# Patient Record
Sex: Male | Born: 1970 | Race: Black or African American | Hispanic: No | Marital: Single | Smoking: Former smoker
Health system: Southern US, Community
[De-identification: ages and names within clinical notes are randomized; demographics above are authoritative.]

## PROBLEM LIST (undated history)

## (undated) DIAGNOSIS — I2699 Other pulmonary embolism without acute cor pulmonale: Secondary | ICD-10-CM

## (undated) DIAGNOSIS — K219 Gastro-esophageal reflux disease without esophagitis: Secondary | ICD-10-CM

## (undated) DIAGNOSIS — C801 Malignant (primary) neoplasm, unspecified: Secondary | ICD-10-CM

## (undated) DIAGNOSIS — D573 Sickle-cell trait: Secondary | ICD-10-CM

## (undated) DIAGNOSIS — D689 Coagulation defect, unspecified: Secondary | ICD-10-CM

## (undated) DIAGNOSIS — R221 Localized swelling, mass and lump, neck: Secondary | ICD-10-CM

## (undated) HISTORY — DX: Coagulation defect, unspecified: D68.9

## (undated) HISTORY — DX: Localized swelling, mass and lump, neck: R22.1

## (undated) HISTORY — PX: SURGERY SCROTAL / TESTICULAR: SUR1316

---

## 2007-06-12 ENCOUNTER — Emergency Department (HOSPITAL_COMMUNITY): Admission: EM | Admit: 2007-06-12 | Discharge: 2007-06-13 | Payer: Self-pay | Admitting: Emergency Medicine

## 2008-06-10 ENCOUNTER — Ambulatory Visit: Payer: Self-pay | Admitting: Gastroenterology

## 2010-10-19 LAB — DIFFERENTIAL
Lymphocytes Relative: 25
Monocytes Absolute: 0.4
Monocytes Relative: 5
Neutro Abs: 4.9

## 2010-10-19 LAB — COMPREHENSIVE METABOLIC PANEL
Albumin: 4.1
BUN: 9
Calcium: 9.2
Creatinine, Ser: 0.78
Total Protein: 7

## 2010-10-19 LAB — URINALYSIS, ROUTINE W REFLEX MICROSCOPIC
Glucose, UA: NEGATIVE
Hgb urine dipstick: NEGATIVE
Ketones, ur: NEGATIVE
Protein, ur: NEGATIVE

## 2010-10-19 LAB — CBC
HCT: 45.3
MCHC: 33.8
MCV: 81.9
Platelets: 176
RDW: 13.8

## 2011-08-24 ENCOUNTER — Other Ambulatory Visit: Payer: Self-pay | Admitting: Family Medicine

## 2011-08-24 DIAGNOSIS — R220 Localized swelling, mass and lump, head: Secondary | ICD-10-CM

## 2011-08-29 ENCOUNTER — Ambulatory Visit
Admission: RE | Admit: 2011-08-29 | Discharge: 2011-08-29 | Disposition: A | Discharge status: Home / Self Care | Payer: 59 | Source: Ambulatory Visit | Attending: Family Medicine | Admitting: Family Medicine

## 2011-08-29 DIAGNOSIS — R221 Localized swelling, mass and lump, neck: Secondary | ICD-10-CM

## 2011-08-29 MED ORDER — IOHEXOL 300 MG/ML  SOLN
75.0000 mL | Freq: Once | INTRAMUSCULAR | Status: AC | PRN
  Administered 2011-08-29: 75 mL via INTRAVENOUS

## 2011-09-14 ENCOUNTER — Encounter (INDEPENDENT_AMBULATORY_CARE_PROVIDER_SITE_OTHER): Payer: Self-pay

## 2011-09-14 ENCOUNTER — Other Ambulatory Visit (INDEPENDENT_AMBULATORY_CARE_PROVIDER_SITE_OTHER): Payer: Self-pay | Admitting: Surgery

## 2011-09-14 ENCOUNTER — Encounter (INDEPENDENT_AMBULATORY_CARE_PROVIDER_SITE_OTHER): Payer: Self-pay | Admitting: Surgery

## 2011-09-14 ENCOUNTER — Ambulatory Visit (INDEPENDENT_AMBULATORY_CARE_PROVIDER_SITE_OTHER): Payer: 59 | Admitting: Surgery

## 2011-09-14 VITALS — BP 138/100 | HR 78 | Temp 98.3°F | Resp 18 | Ht 67.5 in | Wt 251.0 lb

## 2011-09-14 DIAGNOSIS — D1739 Benign lipomatous neoplasm of skin and subcutaneous tissue of other sites: Secondary | ICD-10-CM

## 2011-09-14 DIAGNOSIS — D170 Benign lipomatous neoplasm of skin and subcutaneous tissue of head, face and neck: Secondary | ICD-10-CM | POA: Insufficient documentation

## 2011-09-14 NOTE — Progress Notes (Signed)
Chief Complaint:  Mass of the left neck    History of Present Illness:  Mike Webb is an 41 y.o. male referred by Dr. Lesly Rubenstein with a mass in the left neck and shoulder that appears to be increasing in size.  I discussed removal and the potential for recurrence, nerve pain, weakness or numbness that could occur as a result of removing it.  He wants to have it removed.   Past Medical History  Diagnosis Date  . Neck mass   . Clotting disorder     No past surgical history on file.  Current Outpatient Prescriptions  Medication Sig Dispense Refill  . CLONAZEPAM PO Take by mouth.      . cyclobenzaprine (FLEXERIL) 10 MG tablet Take 10 mg by mouth 3 (three) times daily as needed.      Marland Kitchen HYDROcodone-acetaminophen (NORCO/VICODIN) 5-325 MG per tablet Take 1 tablet by mouth every 6 (six) hours as needed.      . Testosterone (ANDROGEL TD) Place onto the skin.      . Varenicline Tartrate (CHANTIX PO) Take by mouth.      . zolpidem (AMBIEN) 10 MG tablet Take 10 mg by mouth at bedtime as needed.       Review of patient's allergies indicates no known allergies. No family history on file. Social History:   reports that he has quit smoking. He does not have any smokeless tobacco history on file. He reports that he drinks about 6 ounces of alcohol per week. He reports that he does not use illicit drugs.   REVIEW OF SYSTEMS - PERTINENT POSITIVES ONLY: PE after flying on military transport  Physical Exam:   Blood pressure 138/100, pulse 78, temperature 98.3 F (36.8 C), temperature source Oral, resp. rate 18, height 5' 7.5" (1.715 m), weight 251 lb (113.853 kg). Body mass index is 38.73 kg/(m^2).  Gen:  WDWN AAM NAD  Neurological: Alert and oriented to person, place, and time. Motor and sensory function is grossly intact  Head: Normocephalic and atraumatic.  Eyes: Conjunctivae are normal. Pupils are equal, round, and reactive to light. No scleral icterus. There is a large soft mass in the left  supraclavicular region posteriorally.  It is nontender.  Neck: Normal range of motion. Neck supple. No tracheal deviation or thyromegaly present.  Cardiovascular:  SR without murmurs or gallops.  No carotid bruits Respiratory: Effort normal.  No respiratory distress. No chest wall tenderness. Breath sounds normal.  No wheezes, rales or rhonchi.  Abdomen:  nontender GU: Musculoskeletal: Normal range of motion. Extremities are nontender. No cyanosis, edema or clubbing noted Lymphadenopathy: No cervical, preauricular, postauricular or axillary adenopathy is present Skin: Skin is warm and dry. No rash noted. No diaphoresis. No erythema. No pallor. Pscyh: Normal mood and affect. Behavior is normal. Judgment and thought content normal.   LABORATORY RESULTS: No results found for this or any previous visit (from the past 48 hour(s)).  RADIOLOGY RESULTS: No results found.  Problem List: There is no problem list on file for this patient.   Assessment & Plan: Left shoulder/neck mass.  Plan excision under general at [facility]    Matt B. Daphine Deutscher, MD, [facility] [phone] beeper [phone]  09/14/2011 12:05 PM

## 2011-09-14 NOTE — Patient Instructions (Signed)
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 This mass could recur after removal

## 2011-10-27 ENCOUNTER — Telehealth (INDEPENDENT_AMBULATORY_CARE_PROVIDER_SITE_OTHER): Payer: Self-pay | Admitting: General Surgery

## 2011-10-27 ENCOUNTER — Encounter (INDEPENDENT_AMBULATORY_CARE_PROVIDER_SITE_OTHER): Payer: 59 | Admitting: Surgery

## 2011-10-27 NOTE — Telephone Encounter (Signed)
Spoke with pt and informed him that his NP Elizabeth Palau had informed me of his new condition and that after speaking with Dr. Daphine Deutscher that we are going to hold off on his lipoma excision for now.  I explained that after they finish running tests and find a treatment plan for him then we can always revisit this surgery later.  I informed him that he can call us anytime if he has any other problems and when he feels he is ready to remove the lipoma.

## 2011-11-03 ENCOUNTER — Ambulatory Visit (HOSPITAL_BASED_OUTPATIENT_CLINIC_OR_DEPARTMENT_OTHER): Admission: RE | Admit: 2011-11-03 | Payer: 59 | Source: Ambulatory Visit | Admitting: Surgery

## 2011-11-03 ENCOUNTER — Encounter (HOSPITAL_BASED_OUTPATIENT_CLINIC_OR_DEPARTMENT_OTHER): Admission: RE | Payer: Self-pay | Source: Ambulatory Visit

## 2011-11-03 SURGERY — EXCISION LIPOMA
Anesthesia: General

## 2012-02-09 ENCOUNTER — Emergency Department (HOSPITAL_COMMUNITY): Payer: BC Managed Care – PPO

## 2012-02-09 ENCOUNTER — Encounter (HOSPITAL_COMMUNITY): Payer: Self-pay

## 2012-02-09 ENCOUNTER — Inpatient Hospital Stay (HOSPITAL_COMMUNITY)
Admission: EM | Admit: 2012-02-09 | Discharge: 2012-02-12 | DRG: 208 | Disposition: A | Discharge status: Home / Self Care | Payer: BC Managed Care – PPO | Attending: Internal Medicine | Admitting: Internal Medicine

## 2012-02-09 ENCOUNTER — Inpatient Hospital Stay (HOSPITAL_COMMUNITY): Payer: BC Managed Care – PPO

## 2012-02-09 DIAGNOSIS — K8010 Calculus of gallbladder with chronic cholecystitis without obstruction: Principal | ICD-10-CM | POA: Diagnosis present

## 2012-02-09 DIAGNOSIS — K8020 Calculus of gallbladder without cholecystitis without obstruction: Secondary | ICD-10-CM

## 2012-02-09 DIAGNOSIS — F17200 Nicotine dependence, unspecified, uncomplicated: Secondary | ICD-10-CM | POA: Diagnosis present

## 2012-02-09 DIAGNOSIS — Z79899 Other long term (current) drug therapy: Secondary | ICD-10-CM

## 2012-02-09 DIAGNOSIS — I2699 Other pulmonary embolism without acute cor pulmonale: Secondary | ICD-10-CM | POA: Diagnosis present

## 2012-02-09 DIAGNOSIS — K219 Gastro-esophageal reflux disease without esophagitis: Secondary | ICD-10-CM | POA: Diagnosis present

## 2012-02-09 DIAGNOSIS — T391X1A Poisoning by 4-Aminophenol derivatives, accidental (unintentional), initial encounter: Secondary | ICD-10-CM

## 2012-02-09 DIAGNOSIS — K7689 Other specified diseases of liver: Secondary | ICD-10-CM | POA: Diagnosis present

## 2012-02-09 DIAGNOSIS — C801 Malignant (primary) neoplasm, unspecified: Secondary | ICD-10-CM | POA: Diagnosis present

## 2012-02-09 DIAGNOSIS — F329 Major depressive disorder, single episode, unspecified: Secondary | ICD-10-CM

## 2012-02-09 DIAGNOSIS — D573 Sickle-cell trait: Secondary | ICD-10-CM | POA: Diagnosis present

## 2012-02-09 DIAGNOSIS — I10 Essential (primary) hypertension: Secondary | ICD-10-CM

## 2012-02-09 DIAGNOSIS — D689 Coagulation defect, unspecified: Secondary | ICD-10-CM | POA: Diagnosis present

## 2012-02-09 DIAGNOSIS — Z8547 Personal history of malignant neoplasm of testis: Secondary | ICD-10-CM

## 2012-02-09 DIAGNOSIS — R209 Unspecified disturbances of skin sensation: Secondary | ICD-10-CM | POA: Diagnosis present

## 2012-02-09 DIAGNOSIS — R748 Abnormal levels of other serum enzymes: Secondary | ICD-10-CM

## 2012-02-09 DIAGNOSIS — K819 Cholecystitis, unspecified: Secondary | ICD-10-CM | POA: Diagnosis present

## 2012-02-09 DIAGNOSIS — Z86711 Personal history of pulmonary embolism: Secondary | ICD-10-CM

## 2012-02-09 DIAGNOSIS — F3289 Other specified depressive episodes: Secondary | ICD-10-CM | POA: Diagnosis present

## 2012-02-09 DIAGNOSIS — R7401 Elevation of levels of liver transaminase levels: Secondary | ICD-10-CM | POA: Diagnosis present

## 2012-02-09 DIAGNOSIS — R531 Weakness: Secondary | ICD-10-CM | POA: Diagnosis present

## 2012-02-09 DIAGNOSIS — R29898 Other symptoms and signs involving the musculoskeletal system: Secondary | ICD-10-CM | POA: Diagnosis present

## 2012-02-09 DIAGNOSIS — R1011 Right upper quadrant pain: Secondary | ICD-10-CM

## 2012-02-09 DIAGNOSIS — R7402 Elevation of levels of lactic acid dehydrogenase (LDH): Secondary | ICD-10-CM | POA: Diagnosis present

## 2012-02-09 DIAGNOSIS — M6281 Muscle weakness (generalized): Secondary | ICD-10-CM

## 2012-02-09 HISTORY — DX: Other pulmonary embolism without acute cor pulmonale: I26.99

## 2012-02-09 HISTORY — DX: Sickle-cell trait: D57.3

## 2012-02-09 HISTORY — DX: Malignant (primary) neoplasm, unspecified: C80.1

## 2012-02-09 LAB — TROPONIN I: Troponin I: <0.3 ng/mL (ref <0.30)

## 2012-02-09 LAB — RAPID URINE DRUG SCREEN, HOSP PERFORMED
Barbiturates: NOT DETECTED
Benzodiazepines: NOT DETECTED
Cocaine: NOT DETECTED
Opiates: NOT DETECTED
Tetrahydrocannabinol: POSITIVE — AB

## 2012-02-09 LAB — POCT I-STAT, CHEM 8
BUN: <3 mg/dL — ABNORMAL LOW (ref 6–23)
Calcium, Ion: 1.17 mmol/L (ref 1.12–1.23)
Chloride: 106 mEq/L (ref 96–112)
Glucose, Bld: 128 mg/dL — ABNORMAL HIGH (ref 70–99)
TCO2: 25 mmol/L (ref 0–100)

## 2012-02-09 LAB — URINALYSIS, ROUTINE W REFLEX MICROSCOPIC
Glucose, UA: NEGATIVE mg/dL
Ketones, ur: 40 mg/dL — AB
Specific Gravity, Urine: 1.012 (ref 1.005–1.030)
pH: 6 (ref 5.0–8.0)

## 2012-02-09 LAB — PROTIME-INR: Prothrombin Time: 12.4 seconds (ref 11.6–15.2)

## 2012-02-09 LAB — COMPREHENSIVE METABOLIC PANEL
ALT: 907 U/L — ABNORMAL HIGH (ref 0–53)
Alkaline Phosphatase: 127 U/L — ABNORMAL HIGH (ref 39–117)
BUN: 5 mg/dL — ABNORMAL LOW (ref 6–23)
CO2: 24 mEq/L (ref 19–32)
GFR calc Af Amer: >90 mL/min (ref >90)
GFR calc non Af Amer: >90 mL/min (ref >90)
Glucose, Bld: 81 mg/dL (ref 70–99)
Potassium: 3.5 mEq/L (ref 3.5–5.1)
Total Bilirubin: 5.4 mg/dL — ABNORMAL HIGH (ref 0.3–1.2)
Total Protein: 8 g/dL (ref 6.0–8.3)

## 2012-02-09 LAB — CBC WITH DIFFERENTIAL/PLATELET
Eosinophils Absolute: 0.2 10*3/uL (ref 0.0–0.7)
Eosinophils Relative: 3 % (ref 0–5)
Hemoglobin: 15.3 g/dL (ref 13.0–17.0)
Lymphocytes Relative: 22 % (ref 12–46)
Lymphs Abs: 1.1 10*3/uL (ref 0.7–4.0)
MCH: 26.7 pg (ref 26.0–34.0)
MCV: 78.2 fL (ref 78.0–100.0)
Monocytes Relative: 8 % (ref 3–12)
Neutrophils Relative %: 66 % (ref 43–77)
RBC: 5.74 MIL/uL (ref 4.22–5.81)
WBC: 4.9 10*3/uL (ref 4.0–10.5)

## 2012-02-09 LAB — CBC
HCT: 41.4 % (ref 39.0–52.0)
WBC: 5.5 10*3/uL (ref 4.0–10.5)

## 2012-02-09 LAB — LIPASE, BLOOD: Lipase: 15 U/L (ref 11–59)

## 2012-02-09 LAB — APTT: aPTT: 34 seconds (ref 24–37)

## 2012-02-09 MED ORDER — ACETYLCYSTEINE LOAD VIA INFUSION
150.0000 mg/kg | Freq: Once | INTRAVENOUS | Status: AC
  Administered 2012-02-09: 16800 mg via INTRAVENOUS
  Filled 2012-02-09: qty 420

## 2012-02-09 MED ORDER — ONDANSETRON HCL 4 MG/2ML IJ SOLN: 4.0000 mg | Freq: Four times a day (QID) | INTRAMUSCULAR | Status: DC | PRN

## 2012-02-09 MED ORDER — SODIUM CHLORIDE 0.9 % IV SOLN
INTRAVENOUS | Status: AC
  Administered 2012-02-09 – 2012-02-10 (×2): via INTRAVENOUS

## 2012-02-09 MED ORDER — IOHEXOL 300 MG/ML  SOLN
100.0000 mL | Freq: Once | INTRAMUSCULAR | Status: AC | PRN
  Administered 2012-02-09: 100 mL via INTRAVENOUS

## 2012-02-09 MED ORDER — ONDANSETRON HCL 4 MG PO TABS: 4.0000 mg | ORAL_TABLET | Freq: Four times a day (QID) | ORAL | Status: DC | PRN

## 2012-02-09 MED ORDER — CLORAZEPATE DIPOTASSIUM 3.75 MG PO TABS
7.5000 mg | ORAL_TABLET | Freq: Two times a day (BID) | ORAL | Status: DC
  Administered 2012-02-09 – 2012-02-11 (×5): 7.5 mg via ORAL
  Filled 2012-02-09 (×3): qty 2
  Filled 2012-02-09: qty 1
  Filled 2012-02-09: qty 2
  Filled 2012-02-09: qty 1
  Filled 2012-02-09: qty 2

## 2012-02-09 MED ORDER — IBUPROFEN 600 MG PO TABS
600.0000 mg | ORAL_TABLET | Freq: Four times a day (QID) | ORAL | Status: DC | PRN
  Administered 2012-02-10 (×2): 600 mg via ORAL
  Filled 2012-02-09 (×2): qty 1

## 2012-02-09 MED ORDER — ESCITALOPRAM OXALATE 10 MG PO TABS
10.0000 mg | ORAL_TABLET | Freq: Every day | ORAL | Status: DC
  Administered 2012-02-09 – 2012-02-12 (×4): 10 mg via ORAL
  Filled 2012-02-09 (×4): qty 1

## 2012-02-09 MED ORDER — CYCLOBENZAPRINE HCL 10 MG PO TABS
10.0000 mg | ORAL_TABLET | Freq: Three times a day (TID) | ORAL | Status: DC | PRN
  Filled 2012-02-09: qty 1

## 2012-02-09 MED ORDER — MORPHINE SULFATE 2 MG/ML IJ SOLN
2.0000 mg | INTRAMUSCULAR | Status: DC | PRN
  Administered 2012-02-09 – 2012-02-11 (×3): 2 mg via INTRAVENOUS
  Filled 2012-02-09 (×3): qty 1

## 2012-02-09 MED ORDER — ALUM & MAG HYDROXIDE-SIMETH 200-200-20 MG/5ML PO SUSP
30.0000 mL | Freq: Four times a day (QID) | ORAL | Status: DC | PRN
  Administered 2012-02-12 (×2): 30 mL via ORAL
  Filled 2012-02-09 (×2): qty 30

## 2012-02-09 MED ORDER — SODIUM CHLORIDE 0.9 % IJ SOLN
3.0000 mL | Freq: Two times a day (BID) | INTRAMUSCULAR | Status: DC
  Administered 2012-02-09 – 2012-02-11 (×4): 3 mL via INTRAVENOUS

## 2012-02-09 MED ORDER — ALBUTEROL SULFATE (5 MG/ML) 0.5% IN NEBU: 2.5000 mg | INHALATION_SOLUTION | RESPIRATORY_TRACT | Status: DC | PRN

## 2012-02-09 MED ORDER — ASPIRIN 81 MG PO CHEW
81.0000 mg | CHEWABLE_TABLET | Freq: Every day | ORAL | Status: DC
  Administered 2012-02-09 – 2012-02-12 (×4): 81 mg via ORAL
  Filled 2012-02-09 (×4): qty 1

## 2012-02-09 MED ORDER — ACETYLCYSTEINE 200 MG/ML IV SOLN
15.0000 mg/kg/h | INTRAVENOUS | Status: DC
  Administered 2012-02-09 – 2012-02-10 (×3): 15 mg/kg/h via INTRAVENOUS
  Filled 2012-02-09 (×3): qty 200

## 2012-02-09 MED ORDER — HEPARIN SODIUM (PORCINE) 5000 UNIT/ML IJ SOLN
5000.0000 [IU] | Freq: Three times a day (TID) | INTRAMUSCULAR | Status: DC
  Administered 2012-02-09 – 2012-02-12 (×8): 5000 [IU] via SUBCUTANEOUS
  Filled 2012-02-09 (×11): qty 1

## 2012-02-09 MED ORDER — PANTOPRAZOLE SODIUM 40 MG PO TBEC
40.0000 mg | DELAYED_RELEASE_TABLET | Freq: Every day | ORAL | Status: DC
  Administered 2012-02-09: 40 mg via ORAL
  Filled 2012-02-09: qty 1

## 2012-02-09 MED ORDER — IOHEXOL 300 MG/ML  SOLN
50.0000 mL | Freq: Once | INTRAMUSCULAR | Status: AC | PRN
  Administered 2012-02-09: 50 mL via ORAL

## 2012-02-09 MED ORDER — SENNOSIDES-DOCUSATE SODIUM 8.6-50 MG PO TABS
1.0000 | ORAL_TABLET | Freq: Every evening | ORAL | Status: DC | PRN
  Filled 2012-02-09: qty 1

## 2012-02-09 MED ORDER — PIPERACILLIN-TAZOBACTAM 3.375 G IVPB
3.3750 g | Freq: Three times a day (TID) | INTRAVENOUS | Status: AC
  Administered 2012-02-10 – 2012-02-11 (×7): 3.375 g via INTRAVENOUS
  Filled 2012-02-09 (×10): qty 50

## 2012-02-09 MED ORDER — ONDANSETRON HCL 4 MG/2ML IJ SOLN
4.0000 mg | Freq: Four times a day (QID) | INTRAMUSCULAR | Status: DC | PRN
  Administered 2012-02-09 – 2012-02-10 (×2): 4 mg via INTRAVENOUS
  Filled 2012-02-09 (×2): qty 2

## 2012-02-09 MED ORDER — SODIUM CHLORIDE 0.9 % IV BOLUS (SEPSIS)
1000.0000 mL | Freq: Once | INTRAVENOUS | Status: AC
  Administered 2012-02-09: 1000 mL via INTRAVENOUS

## 2012-02-09 MED ORDER — GUAIFENESIN-DM 100-10 MG/5ML PO SYRP
5.0000 mL | ORAL_SOLUTION | ORAL | Status: DC | PRN
  Filled 2012-02-09: qty 5

## 2012-02-09 NOTE — Progress Notes (Signed)
1905 Patient arrived to floor from ED. Placed on telemetry.

## 2012-02-09 NOTE — ED Notes (Signed)
Pt c/o of left side numbness and tingling. sts he was trying to urinate and became weak. Pt sts 10/10 headache. Pt has no facial droop, weak left side hand grip, no bilateral arm drift, and no resistance to gravity with left left drift and some resistance to gravity with right leg drift. Dr. Manus Gunning notified. EDP at bedside by 1540. Code stroke called.

## 2012-02-09 NOTE — ED Notes (Signed)
Code Stroke Canceled by Dr. Leroy Kennedy

## 2012-02-09 NOTE — Consult Note (Addendum)
Referring Physician:    Chief Complaint: left sided weakness and numbness. Code stroke.  HPI:                                                                                                                                         Mike Webb is an 42 y.o. male with a past medical history significant for hypertension, testicular cancer, chronic neck pain, who initially presented to the ER with GI symptomatology felt to be secondary to acetaminophen induced liver damage. While in the ER, he developed sudden onset of left sided numbness and legs weakness. In addition, he started having severe headache but no associated vertigo, double vision, difficulty swallowing, slurred speech, language or vision impairment. He never had similar symptoms before. At the time of assessment by the stroke team he had NIHSS 3, with weakness involving both legs and left arm. However, the weakness remarkably improved on subsequent neuro-exam. CT brain without contrast was unremarkable.   LSN: 3:30 pm tPA Given: no.  Past Medical History  Diagnosis Date  . Neck mass   . Clotting disorder   . Sickle cell trait   . Pulmonary embolism   . Cancer     Past Surgical History  Procedure Date  . Surgery scrotal / testicular     History reviewed. No pertinent family history. Social History:  reports that he has been smoking.  He does not have any smokeless tobacco history on file. He reports that he drinks about 6 ounces of alcohol per week. He reports that he uses illicit drugs (Marijuana).  Allergies: No Known Allergies  Medications:                                                                                                                           I have reviewed the patient's current medications.  ROS:  History obtained from the patient Blood pressure 160/100, pulse 91,  temperature 98.7 F (37.1 C), temperature source Oral, resp. rate 20, height 5\' 7"  (1.702 m), weight 112 kg (246 lb 14.6 oz), SpO2 99.00%. General ROS: negative for - chills, fatigue, fever, night sweats, weight gain or weight loss Psychological ROS: negative for - behavioral disorder, hallucinations, memory difficulties, mood swings or suicidal ideation Ophthalmic ROS: negative for - blurry vision, double vision, eye pain or loss of vision ENT ROS: negative for - epistaxis, nasal discharge, oral lesions, sore throat, tinnitus or vertigo Allergy and Immunology ROS: negative for - hives or itchy/watery eyes Hematological and Lymphatic ROS: negative for - bleeding problems, bruising or swollen lymph nodes Endocrine ROS: negative for - galactorrhea, hair pattern changes, polydipsia/polyuria or temperature intolerance Respiratory ROS: negative for - cough, hemoptysis, shortness of breath or wheezing Cardiovascular ROS: negative for - chest pain, dyspnea on exertion, edema or irregular heartbeat Gastrointestinal ROS: significant for abdominal discomfort, nausea, vomiting, no eating for the past few days. Genito-Urinary ROS: negative for - dysuria, hematuria, incontinence or urinary frequency/urgency Musculoskeletal ROS: neck pain. Neurological ROS: as noted in HPI Dermatological ROS: negative for rash and skin lesion changes   Neurologic Examination:    Mental Status: Alert, awake oriented x 4. Comprehension, naming, and repetition within normal limits. No right to left confusion. Spontaneous language is normal. No dysarthria. II: Discs flat bilaterally; Visual fields grossly normal, pupils equal, round, reactive to light and accommodation III,IV, VI: ptosis not present, extra-ocular motions intact bilaterally V,VII: smile symmetric, facial light touch sensation normal bilaterally VIII: hearing normal bilaterally IX,X: gag reflex present XI: bilateral shoulder shrug XII: midline tongue  extension Motor: 5/5 all over. Tone and bulk:normal tone throughout; no atrophy noted Sensory: Pinprick and light touch intact throughout, bilaterally Deep Tendon Reflexes: 2+ and symmetric throughout Plantars: Right: downgoing   Left: downgoing Cerebellar: normal finger-to-nose,  normal heel-to-shin test Gait: no tested. CV: pulses palpable throughout     Results for orders placed during the hospital encounter of 02/09/12 (from the past 48 hour(s))  CBC WITH DIFFERENTIAL     Status: Normal   Collection Time   02/09/12 12:53 PM      Component Value Range Comment   WBC 4.9  4.0 - 10.5 K/uL    RBC 5.74  4.22 - 5.81 MIL/uL    Hemoglobin 15.3  13.0 - 17.0 g/dL    HCT 16.1  09.6 - 04.5 %    MCV 78.2  78.0 - 100.0 fL    MCH 26.7  26.0 - 34.0 pg    MCHC 34.1  30.0 - 36.0 g/dL    RDW 40.9  81.1 - 91.4 %    Platelets 183  150 - 400 K/uL    Neutrophils Relative 66  43 - 77 %    Neutro Abs 3.2  1.7 - 7.7 K/uL    Lymphocytes Relative 22  12 - 46 %    Lymphs Abs 1.1  0.7 - 4.0 K/uL    Monocytes Relative 8  3 - 12 %    Monocytes Absolute 0.4  0.1 - 1.0 K/uL    Eosinophils Relative 3  0 - 5 %    Eosinophils Absolute 0.2  0.0 - 0.7 K/uL    Basophils Relative 1  0 - 1 %    Basophils Absolute 0.1  0.0 - 0.1 K/uL   PROTIME-INR     Status: Normal   Collection Time   02/09/12 12:53 PM  Component Value Range Comment   Prothrombin Time 12.4  11.6 - 15.2 seconds    INR 0.93  0.00 - 1.49   APTT     Status: Normal   Collection Time   02/09/12 12:53 PM      Component Value Range Comment   aPTT 34  24 - 37 seconds   URINALYSIS, ROUTINE W REFLEX MICROSCOPIC     Status: Abnormal   Collection Time   02/09/12  2:20 PM      Component Value Range Comment   Color, Urine ORANGE (*) YELLOW BIOCHEMICALS MAY BE AFFECTED BY COLOR   APPearance CLEAR  CLEAR    Specific Gravity, Urine 1.012  1.005 - 1.030    pH 6.0  5.0 - 8.0    Glucose, UA NEGATIVE  NEGATIVE mg/dL    Hgb urine dipstick NEGATIVE   NEGATIVE    Bilirubin Urine LARGE (*) NEGATIVE    Ketones, ur 40 (*) NEGATIVE mg/dL    Protein, ur NEGATIVE  NEGATIVE mg/dL    Urobilinogen, UA 2.0 (*) 0.0 - 1.0 mg/dL    Nitrite NEGATIVE  NEGATIVE    Leukocytes, UA NEGATIVE  NEGATIVE MICROSCOPIC NOT DONE ON URINES WITH NEGATIVE PROTEIN, BLOOD, LEUKOCYTES, NITRITE, OR GLUCOSE <1000 mg/dL.  POCT I-STAT, CHEM 8     Status: Abnormal   Collection Time   02/09/12  3:57 PM      Component Value Range Comment   Sodium 142  135 - 145 mEq/L    Potassium 3.5  3.5 - 5.1 mEq/L    Chloride 106  96 - 112 mEq/L    BUN <3 (*) 6 - 23 mg/dL    Creatinine, Ser 1.61  0.50 - 1.35 mg/dL    Glucose, Bld 096 (*) 70 - 99 mg/dL    Calcium, Ion 0.45  4.09 - 1.23 mmol/L    TCO2 25  0 - 100 mmol/L    Hemoglobin 16.3  13.0 - 17.0 g/dL    HCT 81.1  91.4 - 78.2 %   POCT I-STAT TROPONIN I     Status: Normal   Collection Time   02/09/12  3:57 PM      Component Value Range Comment   Troponin i, poc 0.00  0.00 - 0.08 ng/mL    Comment 3             Dg Chest 2 View  02/09/2012  *RADIOLOGY REPORT*  Clinical Data: Chest pain, weakness  CHEST - 2 VIEW  Comparison: None.  Findings: Cardiomediastinal silhouette is unremarkable.  No acute infiltrate or pleural effusion.  No pulmonary edema.  Bony thorax is unremarkable.  IMPRESSION: No active disease.   Original Report Authenticated By: Natasha Mead, M.D.    Ct Head Wo Contrast  02/09/2012  *RADIOLOGY REPORT*  Clinical Data: Left-sided weakness. Code stroke.  CT HEAD WITHOUT CONTRAST  Technique:  Contiguous axial images were obtained from the base of the skull through the vertex without contrast.  Comparison: None.  Findings: Normal appearance of the intracranial structures.  There is no evidence for acute hemorrhage, mass lesion, midline shift, hydrocephalus or large infarct.  There is deformity of the right medial orbital that probably represents old trauma.  Frontal sinuses are absent.  IMPRESSION: No acute intracranial  abnormality.  These results were called by telephone on 02/09/2012 at 3:58 p.m. to Dr. Leroy Kennedy, who verbally acknowledged these results.   Original Report Authenticated By: Richarda Overlie, M.D.    US Abdomen Complete  02/09/2012  *  RADIOLOGY REPORT*  Clinical Data:  Elevated liver function tests.  COMPLETE ABDOMINAL ULTRASOUND  Comparison:  06/21/2007  Findings:  Gallbladder:  A "wall echo shadow complex".  No pericholecystic fluid.  Wall thickness upper normal, 3 mm.  The patient is tender over the gallbladder, but sonographic Murphy's sign was not elicited.  Common bile duct: Normal, 5 mm.  Liver: Mildly increased in echogenicity with mildly heterogeneous echotexture.  IVC: Negative  Pancreas:  Partially obscured pancreatic tail secondary bowel gas.  Spleen:  Normal in size and echogenicity.  Right Kidney:  13.4 cm. No hydronephrosis.  Left Kidney:  13.1 cm. No hydronephrosis.  Abdominal aorta:  Nonaneurysmal without ascites.  IMPRESSION: "Wall echo shadow" complex, most consistent with a stone filled gallbladder.  The largest stone measures 1.5 cm.  Although the patient is tender in the region of the gallbladder, no specific evidence of acute cholecystitis is identified.  Heterogeneous hepatic steatosis.   Original Report Authenticated By: Jeronimo Greaves, M.D.      Felicie Morn PA-C [facility] [phone]  02/09/2012, 4:25 PM   Assessment: 42 y.o. male with hypertension and new onset left upper and bilateral LE weakness (resolved) in association with left arm numbness and headache. Will consider the possibility of posterior circulation TIA. Plan: MRI/MRA brain. Carotid ultrasound. Aspirin 81 mg daily. Statins. Wyatt Portela, MD

## 2012-02-09 NOTE — Progress Notes (Signed)
Report called to [facility] nurse.  Patient taken to CT and then will be transported to [facility].

## 2012-02-09 NOTE — Progress Notes (Addendum)
MEDICATION RELATED CONSULT NOTE - INITIAL   Pharmacy Consult for Acetylcysteine Indication: Suspected Tylenol Overdose (chronic)  No Known Allergies  Patient Measurements: Height: 5\' 7"  (170.2 cm) Weight: 246 lb 14.6 oz (112 kg) IBW/kg (Calculated) : 66.1  Adjusted Body Weight: 80 kg  Vital Signs: Temp: 98.7 F (37.1 C) (01/17 1144) Temp src: Oral (01/17 1144) BP: 136/102 mmHg (01/17 1144) Pulse Rate: 90  (01/17 1144) Intake/Output from previous day:   Intake/Output from this shift:    Labs: No results found for this basename: WBC:3,HGB:3,HCT:3,PLT:3,APTT:3;INR:3,CREATININE:3,LABCREA:3,CREATININE:3,CREAT24HRUR:3,MG:3,PHOS:3,ALBUMIN:3,PROT:3,ALBUMIN:3,AST:3,ALT:3,ALKPHOS:3,BILITOT:3,BILIDIR:3,IBILI:3 in the last 72 hours Estimated Creatinine Clearance: 148.2 ml/min (by C-G formula based on Cr of 0.78).   Microbiology: No results found for this or any previous visit (from the past 720 hour(s)).  Medical History: Past Medical History  Diagnosis Date  . Neck mass   . Clotting disorder   . Sickle cell trait   . Pulmonary embolism   . Cancer     Medications:  See electronic med rec  Assessment: 42 y.o. male presented to ED today with 1 wk h/o N/V, chills, neck/back pain. Noted pt to Urgent Care yesterday and had elevated hepatic enzymes. Suspected chronic Tylenol overdose. Pt reports taking a lot of tylenol for past 2 months (he states that he would take 3-4 extra strength tablets every 3-4 hours around the clock for headache and back pain). The last dose of Tylenol he took was on Wed evening.  Goals of Therapy:  1) Patient is asymptomatic 2) AST and ALT normal 3) Serum bicarbonate ? 20 or pH ? 7.25 4) INR < 2 (if required) 5) Acetaminophen level ? 10 mcg/mL  Plan:  1) Acetylcysteine IV 150mg /kg load then 15 mg/kg/hr 2) Will f/u baseline CMET, PT/INR, APAP level - labs sent and tube system now down so that is delay 3) Will f/u CMET, INR, acetaminophen level in 24  hours 4) Consider d/c acetylcysteine at 24 hrs if above goals of therapy are met and [employer] agrees.  Christoper Fabian, PharmD, BCPS Clinical pharmacist, pager [phone] 02/09/2012,1:17 PM  Addendum 1900 Ulyses Southward, PharmD, BCPS):  I spoke with [place] poison control this PM and reported the case to them. I updated them with current labs:  APAP<15 AST 356 ALT 907 INR 0.93 Bili 5.4  Pt has received a loading dose of mucomust and is currently on the drip. Poison control recommends cont with current therapy for the next 23 hrs and will recheck labs at that point and determine course of therapy.  Plan Cont mucomyst at 15 mg/kg/hr Recheck labs in AM

## 2012-02-09 NOTE — ED Notes (Addendum)
Pt presents with 1 week h/o nausea and vomiting.  Decreased appetite x 3 days, reports dark-colored urine x 3 days.  Pt reports chills, sweats, reports neck and back pain.  Pt seen at Urgent Care yesterday, had elevated hepatic levels.  Pt reports surgery for testicular cancer in October.  Pt reports taking a lot of tylenol x 2 months (500mg  x 3 every 3-4 hours).

## 2012-02-09 NOTE — Progress Notes (Signed)
Was investigating the orders and saw that pt had orders for NIH, stroke swallow screen, MRI/MRA, and neuro checks. This is r/o stroke and we requested a neuro bed from the [facility]. Updated pt on plan.

## 2012-02-09 NOTE — H&P (Signed)
[facility]                                                                                    Patient Demographics  Mike Webb, is a 42 y.o. male  CSN: [id number]  MRN: [id number]  DOB - [birth year]  Admit Date - 02/09/2012  Outpatient Primary MD for the patient is No primary provider on file.   With History of -  Past Medical History  Diagnosis Date  . Neck mass   . Clotting disorder   . Sickle cell trait   . Pulmonary embolism   . Cancer       Past Surgical History  Procedure Date  . Surgery scrotal / testicular     in for   Right upper quadrant abdominal pain.   HPI  Mike Webb  is a 42 y.o. male, with history of sickle cell trait, PE more than 10 years ago, who was in good state of health until about one week ago when he started experiencing right upper quadrant abdominal pain associated with nausea, pain is sharp intermittent nonradiating worse with eating food better with bowel rest and pain medications, he presented himself to urgent care center where he was given Tylenol-based pain medication which she had been taking Librium, presented to the ER as he was not feeling better, in the ER CMP suggested elevated liver enzymes, acetaminophen level was stable, ultrasound suggested possible cholecystitis with gallbladder full of stones, patient also while sitting in the ER had a sudden onset of left-sided numbness and weakness lasting 10 minutes, cold stroke was called, head CT was stable, patient symptom has since resolved. He has been seen by neurologist and surgery has been consulted Dr. Derrell Lolling will see the patient tomorrow.    Review of Systems    In addition to the HPI above,  No Fever-chills, No Headache, No changes with Vision or hearing, No problems swallowing food or Liquids, No Chest pain, Cough or Shortness of Breath, No Abdominal pain, positive Nausea or Vommitting, Bowel movements are regular, No Blood in stool or Urine, No  dysuria, No new skin rashes or bruises, No new joints pains-aches,  No new weakness, tingling, numbness in any extremity, No recent weight gain or loss, No polyuria, polydypsia or polyphagia, No significant Mental Stressors.  A full 10 point Review of Systems was done, except as stated above, all other Review of Systems were negative.   Social History History  Substance Use Topics  . Smoking status: Current Every Day Smoker -- 0.5 packs/day  . Smokeless tobacco: Not on file  . Alcohol Use: 6.0 oz/week    12 drink(s) per week     Family History History of CVA in family members including his mother  Prior to Admission medications   Medication Sig Start Date End Date Taking? Authorizing Provider  acetaminophen (TYLENOL) 500 MG tablet Take 1,000 mg by mouth every 6 (six) hours as needed. For pain   Yes Historical Provider, MD  clorazepate (TRANXENE) 7.5 MG tablet Take 7.5 mg by mouth 2 (two) times daily.   Yes Historical Provider, MD  cyclobenzaprine (FLEXERIL) 10 MG tablet Take 10 mg by mouth  3 (three) times daily as needed. For muscle spasms   Yes Historical Provider, MD  ergocalciferol (VITAMIN D2) 50000 UNITS capsule Take 50,000 Units by mouth once a week.   Yes Historical Provider, MD  escitalopram (LEXAPRO) 10 MG tablet Take 10 mg by mouth daily.   Yes Historical Provider, MD  ibuprofen (ADVIL,MOTRIN) 200 MG tablet Take 800 mg by mouth every 6 (six) hours as needed. For pain   Yes Historical Provider, MD  lisinopril (PRINIVIL,ZESTRIL) 20 MG tablet Take 20 mg by mouth daily.   Yes Historical Provider, MD  Multiple Vitamin (MULTIVITAMIN WITH MINERALS) TABS Take 1 tablet by mouth daily.   Yes Historical Provider, MD  Testosterone (ANDROGEL PUMP) 20.25 MG/ACT (1.62%) GEL Place 1 application onto the skin 2 (two) times daily.   Yes Historical Provider, MD  Varenicline Tartrate (CHANTIX PO) Take 1 mg by mouth 2 (two) times daily.    Yes Historical Provider, MD  zolpidem (AMBIEN) 10 MG  tablet Take 10 mg by mouth at bedtime as needed. For sleep   Yes Historical Provider, MD    No Known Allergies  Physical Exam  Vitals  Blood pressure 153/94, pulse 88, temperature 98.7 F (37.1 C), temperature source Oral, resp. rate 20, height 5\' 7"  (1.702 m), weight 112 kg (246 lb 14.6 oz), SpO2 97.00%.   1. General middle-aged African American male lying in bed in mild abdominal pain in the right upper quadrant  2. Normal affect and insight, Not Suicidal or Homicidal, Awake Alert, Oriented X 3.  3. No F.N deficits, ALL C.Nerves Intact, Strength 5/5 all 4 extremities, Sensation intact all 4 extremities, Plantars down going.  4. Ears and Eyes appear Normal, Conjunctivae clear, PERRLA. Moist Oral Mucosa.  5. Supple Neck, No JVD, No cervical lymphadenopathy appriciated, No Carotid Bruits.  6. Symmetrical Chest wall movement, Good air movement bilaterally, CTAB.  7. RRR, No Gallops, Rubs or Murmurs, No Parasternal Heave.  8. Positive Bowel Sounds, Abdomen Soft, right upper quadrant tender, No organomegaly appriciated,No rebound -guarding or rigidity.  9.  No Cyanosis, Normal Skin Turgor, No Skin Rash or Bruise.  10. Good muscle tone,  joints appear normal , no effusions, Normal ROM.  11. No Palpable Lymph Nodes in Neck or Axillae     Data Review  CBC  Lab 02/09/12 1557 02/09/12 1253  WBC -- 4.9  HGB 16.3 15.3  HCT 48.0 44.9  PLT -- 183  MCV -- 78.2  MCH -- 26.7  MCHC -- 34.1  RDW -- 13.6  LYMPHSABS -- 1.1  MONOABS -- 0.4  EOSABS -- 0.2  BASOSABS -- 0.1  BANDABS -- --   ------------------------------------------------------------------------------------------------------------------  Chemistries   Lab 02/09/12 1557 02/09/12 1253  NA 142 141  K 3.5 3.5  CL 106 103  CO2 -- 24  GLUCOSE 128* 81  BUN <3* 5*  CREATININE 1.00 0.72  CALCIUM -- 9.9  MG -- --  AST -- 356*  ALT -- 907*  ALKPHOS -- 127*  BILITOT -- 5.4*    ------------------------------------------------------------------------------------------------------------------ estimated creatinine clearance is 118.5 ml/min (by C-G formula based on Cr of 1). ------------------------------------------------------------------------------------------------------------------ No results found for this basename: TSH,T4TOTAL,FREET3,T3FREE,THYROIDAB in the last 72 hours   Coagulation profile  Lab 02/09/12 1253  INR 0.93  PROTIME --   ------------------------------------------------------------------------------------------------------------------- No results found for this basename: DDIMER:2 in the last 72 hours -------------------------------------------------------------------------------------------------------------------  Cardiac Enzymes  Lab 02/09/12 1545  CKMB --  TROPONINI <0.30  MYOGLOBIN --   ------------------------------------------------------------------------------------------------------------------ No components found with  this basename: POCBNP:3   ---------------------------------------------------------------------------------------------------------------  Urinalysis    Component Value Date/Time   COLORURINE ORANGE* 02/09/2012 1420   APPEARANCEUR CLEAR 02/09/2012 1420   LABSPEC 1.012 02/09/2012 1420   PHURINE 6.0 02/09/2012 1420   GLUCOSEU NEGATIVE 02/09/2012 1420   HGBUR NEGATIVE 02/09/2012 1420   BILIRUBINUR LARGE* 02/09/2012 1420   KETONESUR 40* 02/09/2012 1420   PROTEINUR NEGATIVE 02/09/2012 1420   UROBILINOGEN 2.0* 02/09/2012 1420   NITRITE NEGATIVE 02/09/2012 1420   LEUKOCYTESUR NEGATIVE 02/09/2012 1420    ----------------------------------------------------------------------------------------------------------------  Imaging results:   Dg Chest 2 View  02/09/2012  *RADIOLOGY REPORT*  Clinical Data: Chest pain, weakness  CHEST - 2 VIEW  Comparison: None.  Findings: Cardiomediastinal silhouette is unremarkable.  No  acute infiltrate or pleural effusion.  No pulmonary edema.  Bony thorax is unremarkable.  IMPRESSION: No active disease.   Original Report Authenticated By: Natasha Mead, M.D.    Ct Head Wo Contrast  02/09/2012  *RADIOLOGY REPORT*  Clinical Data: Left-sided weakness. Code stroke.  CT HEAD WITHOUT CONTRAST  Technique:  Contiguous axial images were obtained from the base of the skull through the vertex without contrast.  Comparison: None.  Findings: Normal appearance of the intracranial structures.  There is no evidence for acute hemorrhage, mass lesion, midline shift, hydrocephalus or large infarct.  There is deformity of the right medial orbital that probably represents old trauma.  Frontal sinuses are absent.  IMPRESSION: No acute intracranial abnormality.  These results were called by telephone on 02/09/2012 at 3:58 p.m. to Dr. Leroy Kennedy, who verbally acknowledged these results.   Original Report Authenticated By: Richarda Overlie, M.D.    US Abdomen Complete  02/09/2012  *RADIOLOGY REPORT*  Clinical Data:  Elevated liver function tests.  COMPLETE ABDOMINAL ULTRASOUND  Comparison:  06/21/2007  Findings:  Gallbladder:  A "wall echo shadow complex".  No pericholecystic fluid.  Wall thickness upper normal, 3 mm.  The patient is tender over the gallbladder, but sonographic Murphy's sign was not elicited.  Common bile duct: Normal, 5 mm.  Liver: Mildly increased in echogenicity with mildly heterogeneous echotexture.  IVC: Negative  Pancreas:  Partially obscured pancreatic tail secondary bowel gas.  Spleen:  Normal in size and echogenicity.  Right Kidney:  13.4 cm. No hydronephrosis.  Left Kidney:  13.1 cm. No hydronephrosis.  Abdominal aorta:  Nonaneurysmal without ascites.  IMPRESSION: "Wall echo shadow" complex, most consistent with a stone filled gallbladder.  The largest stone measures 1.5 cm.  Although the patient is tender in the region of the gallbladder, no specific evidence of acute cholecystitis is identified.   Heterogeneous hepatic steatosis.   Original Report Authenticated By: Jeronimo Greaves, M.D.     My personal review of EKG: Rhythm NSR, Rate 80s /min, mild nonspecific T-wave changes in lateral leads    Assessment & Plan   1. Right upper quadrant abdominal pain. Elevated liver enzymes. Liberal use of acetaminophen in the last week.- Patient on exam does not appear to have fulminant acute cholecystitis however he might have subacute cholecystitis going for the last 1 week, there is also a chance of acetaminophen overdose although levels currently are stable, plan is to admit the patient on telemetry bed, n.p.o., IV fluids, IV Zosyn, general surgery to see the patient, pharmacy consult has been placed placed patient on IV acetadote to per protocol, will monitor CMP level serially. Patient might need cholecystectomy. I question whether he passed a CBD stone earlier this week.    2. Episode of left-sided weakness  transient in the ER which has since resolved. This could be complex migraine versus TIA - head CT stable, EKG shows sinus rhythm, patient will be placed on aspirin, he has been seen by neurologist, TIA workup will be initiated which will include MRI MRA brain, carotid Doppler, echo gram, telemetry monitoring, Motrin for headache control.    3. History of sickle cell trait and PE in the past. No acute issues we'll try to keep patient hydrated with IV fluids, heparin for DVT prophylaxis.     4. Nonspecific EKG change found incidentally. Patient is chest pain-free, but be placed on aspirin, will cycle troponins, echogram as above, repeat EKG in the morning.    DVT Prophylaxis Heparin   AM Labs Ordered, also please review Full Orders  Family Communication: Admission, patients condition and plan of care including tests being ordered have been discussed with the patient who indicates understanding and agree with the plan and Code Status.  Code Status full  Likely DC to  home  Time spent  in minutes : 35  Condition Marinell Blight K M.D on 02/09/2012 at 5:18 PM  Between 7am to 7pm - Pager - [phone]  After 7pm go to [email or web address] - password [email or web address]  And look for the night coverage person covering me after hours  [facility] Office  [phone]

## 2012-02-09 NOTE — ED Notes (Signed)
Patient transported to X-ray 

## 2012-02-09 NOTE — ED Notes (Signed)
Pt. Numbness and tingling resolved pt able to move all extremities freely, denies weakness sts headache is resolving.

## 2012-02-09 NOTE — ED Provider Notes (Signed)
History  This chart was scribed for Glynn Octave, MD by Bennett Scrape, ED Scribe. This patient was seen in room A07C/A07C and the patient's care was started at 12:33 PM.  CSN: [id number]  Arrival date & time 02/09/12  1128   First MD Initiated Contact with Patient 02/09/12 1233      No chief complaint on file.   The history is provided by the patient. No language interpreter was used.    Mike Webb is a 42 y.o. male who presents to the Emergency Department complaining of one week of non-changing, constant, non-radiating RUQ abdominal pain with associated nausea, emesis, subjective fevers, chills and hematuria for the past 3 days. He reports that he has been taking 3 500 mg of ibuprofen pills 4 times a day for the past week for a known neck mass with no improvement. He reports that he has called his PCP to get his hydrocodone prescription refilled, which did improve his symptoms, with no resolvement. He states that he was seen at Urgent Care in [facility] yesterday where he had labs drawn. He states that he received a call from them today about elevated liver enzymes and was told to come to the ED for evaluation. He denies back pain, CP, hematochezia and SOB as associated symptoms. 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 He states that he is CA free currently and denies getting chemotherapy. He is a current everyday smoker and occasional alcohol user.  PCP is Dr. Providence Lanius at [facility]  Past Medical History  Diagnosis Date  . Neck mass   . Clotting disorder   . Sickle cell trait   . Pulmonary embolism   . Cancer     Past Surgical History  Procedure Date  . Surgery scrotal / testicular     History reviewed. No pertinent family history.  History  Substance Use Topics  . Smoking status: Current Every Day Smoker -- 0.5 packs/day  . Smokeless tobacco: Not on file  . Alcohol Use: 6.0 oz/week    12 drink(s) per week      Review of  Systems  A complete 10 system review of systems was obtained and all systems are negative except as noted in the HPI and PMH.   Allergies  Review of patient's allergies indicates no known allergies.  Home Medications   Current Outpatient Rx  Name  Route  Sig  Dispense  Refill  . ACETAMINOPHEN 500 MG PO TABS   Oral   Take 1,000 mg by mouth every 6 (six) hours as needed. For pain         . CLORAZEPATE DIPOTASSIUM 7.5 MG PO TABS   Oral   Take 7.5 mg by mouth 2 (two) times daily.         . CYCLOBENZAPRINE HCL 10 MG PO TABS   Oral   Take 10 mg by mouth 3 (three) times daily as needed. For muscle spasms         . ERGOCALCIFEROL 50000 UNITS PO CAPS   Oral   Take 50,000 Units by mouth once a week.         Marland Kitchen ESCITALOPRAM OXALATE 10 MG PO TABS   Oral   Take 10 mg by mouth daily.         . IBUPROFEN 200 MG PO TABS   Oral   Take 800 mg by mouth every 6 (six) hours as needed. For pain         .  LISINOPRIL 20 MG PO TABS   Oral   Take 20 mg by mouth daily.         . ADULT MULTIVITAMIN W/MINERALS CH   Oral   Take 1 tablet by mouth daily.         . TESTOSTERONE 20.25 MG/ACT (1.62%) TD GEL   Transdermal   Place 1 application onto the skin 2 (two) times daily.         . CHANTIX PO   Oral   Take 1 mg by mouth 2 (two) times daily.          Marland Kitchen ZOLPIDEM TARTRATE 10 MG PO TABS   Oral   Take 10 mg by mouth at bedtime as needed. For sleep           Triage Vitals: BP 136/102  Pulse 90  Temp 98.7 F (37.1 C) (Oral)  Resp 18  SpO2 97%  Physical Exam  Nursing note and vitals reviewed. Constitutional: He is oriented to person, place, and time. He appears well-developed and well-nourished. No distress.  HENT:  Head: Normocephalic and atraumatic.  Eyes: Conjunctivae normal and EOM are normal. Scleral icterus (mild) is present.  Neck: Neck supple. No tracheal deviation present.       No meningismus  Cardiovascular: Normal rate and regular rhythm.     Pulmonary/Chest: Effort normal and breath sounds normal. No respiratory distress.  Abdominal: Soft. He exhibits no distension. There is tenderness (mild RUQ tenderness).       No appreciable hepatomegaly   Musculoskeletal: Normal range of motion.       Left paraspinal surgical lymphoma   Neurological: He is alert and oriented to person, place, and time.  Skin: Skin is warm and dry.  Psychiatric: He has a normal mood and affect. His behavior is normal.    ED Course  Procedures (including critical care time)  DIAGNOSTIC STUDIES: Oxygen Saturation is 97% on room air, adequate by my interpretation.    COORDINATION OF CARE: 12:39 PM- Discussed treatment plan which includes CBC, [place] of liver and tylenol toxicity reversal with pt at bedside and pt agreed to plan.    Labs Reviewed  COMPREHENSIVE METABOLIC PANEL - Abnormal; Notable for the following:    BUN 5 (*)     AST 356 (*)     ALT 907 (*)     Alkaline Phosphatase 127 (*)     Total Bilirubin 5.4 (*)     All other components within normal limits  SALICYLATE LEVEL - Abnormal; Notable for the following:    Salicylate Lvl <2.0 (*)     All other components within normal limits  URINALYSIS, ROUTINE W REFLEX MICROSCOPIC - Abnormal; Notable for the following:    Color, Urine ORANGE (*)  BIOCHEMICALS MAY BE AFFECTED BY COLOR   Bilirubin Urine LARGE (*)     Ketones, ur 40 (*)     Urobilinogen, UA 2.0 (*)     All other components within normal limits  POCT I-STAT, CHEM 8 - Abnormal; Notable for the following:    BUN <3 (*)     Glucose, Bld 128 (*)     All other components within normal limits  GLUCOSE, CAPILLARY - Abnormal; Notable for the following:    Glucose-Capillary 127 (*)     All other components within normal limits  CBC WITH DIFFERENTIAL  PROTIME-INR  LIPASE, BLOOD  ACETAMINOPHEN LEVEL  ETHANOL  TROPONIN I  APTT  POCT I-STAT TROPONIN I  URINE RAPID DRUG SCREEN (  HOSP PERFORMED)   Dg Chest 2 View  02/09/2012   *RADIOLOGY REPORT*  Clinical Data: Chest pain, weakness  CHEST - 2 VIEW  Comparison: None.  Findings: Cardiomediastinal silhouette is unremarkable.  No acute infiltrate or pleural effusion.  No pulmonary edema.  Bony thorax is unremarkable.  IMPRESSION: No active disease.   Original Report Authenticated By: Natasha Mead, M.D.    Ct Head Wo Contrast  02/09/2012  *RADIOLOGY REPORT*  Clinical Data: Left-sided weakness. Code stroke.  CT HEAD WITHOUT CONTRAST  Technique:  Contiguous axial images were obtained from the base of the skull through the vertex without contrast.  Comparison: None.  Findings: Normal appearance of the intracranial structures.  There is no evidence for acute hemorrhage, mass lesion, midline shift, hydrocephalus or large infarct.  There is deformity of the right medial orbital that probably represents old trauma.  Frontal sinuses are absent.  IMPRESSION: No acute intracranial abnormality.  These results were called by telephone on 02/09/2012 at 3:58 p.m. to Dr. Leroy Kennedy, who verbally acknowledged these results.   Original Report Authenticated By: Richarda Overlie, M.D.    US Abdomen Complete  02/09/2012  *RADIOLOGY REPORT*  Clinical Data:  Elevated liver function tests.  COMPLETE ABDOMINAL ULTRASOUND  Comparison:  06/21/2007  Findings:  Gallbladder:  A "wall echo shadow complex".  No pericholecystic fluid.  Wall thickness upper normal, 3 mm.  The patient is tender over the gallbladder, but sonographic Murphy's sign was not elicited.  Common bile duct: Normal, 5 mm.  Liver: Mildly increased in echogenicity with mildly heterogeneous echotexture.  IVC: Negative  Pancreas:  Partially obscured pancreatic tail secondary bowel gas.  Spleen:  Normal in size and echogenicity.  Right Kidney:  13.4 cm. No hydronephrosis.  Left Kidney:  13.1 cm. No hydronephrosis.  Abdominal aorta:  Nonaneurysmal without ascites.  IMPRESSION: "Wall echo shadow" complex, most consistent with a stone filled gallbladder.  The largest  stone measures 1.5 cm.  Although the patient is tender in the region of the gallbladder, no specific evidence of acute cholecystitis is identified.  Heterogeneous hepatic steatosis.   Original Report Authenticated By: Jeronimo Greaves, M.D.      1. Clotting disorder   2. Sickle cell trait   3. Pulmonary embolism   4. Cancer   5. Cholecystitis   6. Elevated liver enzymes   7. Left-sided weakness       MDM  Patient presents with one week history of diffuse abdominal pain nausea and vomiting with decreased appetite and dark colored urine. He was seen in urgent care yesterday and called today and told he had elevated LFTs. He was given IV fluids and sent home yesterday. He admits to taking 1500 mg of Tylenol every 3 hours for the past several days. N-acetylcysteine dosing initiated on patient arrival.  Patient's blood work was lost in tubing system and had a collected.  At 1530., patient complained of new onset left-sided numbness and tingling and weakness in his arm and leg. He also complained of a headache. On reassessment she has no facial droop but has weakness in his left grip strength no pronator drift and unable to hold his left leg up against gravity. He complains of paresthesias and numbness and tingling. Code stroke was activated at 1542.  CT head negative. Patient was evaluated by Dr. Leroy Kennedy and the stroke team.  He complains of a gradual onset headache that is not have a history of migraines. His left-sided weakness appears to be improving. Neurology team favors complicated  migraine versus TIA. He is not a TPA candidate given rapidly improving symptoms  LFT elevation in bilirubin elevation noted but improved from yesterday. Ultrasound shows stones in the gallbladder without fluid collection. Dr. Derrell Lolling of surgery will consult.  Recommends CT to evaluate pancreas.  Suspects LFTs elevation may be due to APAP. Dr. Thedore Mins wil admit.  CRITICAL CARE Performed by: Glynn Octave   Total critical care time: 45  Critical care time was exclusive of separately billable procedures and treating other patients.  Critical care was necessary to treat or prevent imminent or life-threatening deterioration.  Critical care was time spent personally by me on the following activities: development of treatment plan with patient and/or surrogate as well as nursing, discussions with consultants, evaluation of patient's response to treatment, examination of patient, obtaining history from patient or surrogate, ordering and performing treatments and interventions, ordering and review of laboratory studies, ordering and review of radiographic studies, pulse oximetry and re-evaluation of patient's condition.   Date: 02/09/2012  Rate: 85  Rhythm: normal sinus rhythm  QRS Axis: normal  Intervals: normal  ST/T Wave abnormalities: normal  Conduction Disutrbances:none  Narrative Interpretation:   Old EKG Reviewed: none available   I personally performed the services described in this documentation, which was scribed in my presence. The recorded information has been reviewed and is accurate.       Glynn Octave, MD 02/09/12 [phone]

## 2012-02-09 NOTE — Consult Note (Addendum)
Reason for Consult:questionable cholecystitis Referring Physician: Dr. Becky Sax Knauff is an 42 y.o. male.  HPI: the patient is a 42 year old male status post removal of his left testicle secondary to stage II testicular cancer. This took place in October. Patient was started on Percocet. After the acute recovery the patient began taking Tylenol proximally 5-6 g a day for the past 2 months. Patient states that approximately the patient began having right upper quadrant pain. This was followed by dark urine. He states he's noticed no change in his bowel movement color. The patient underwent ultrasound which revealed a gallstone in the gallbladder but no signs of acute cholecystitis. Patient was seen in his PCP earlier this week and found to have elevated LFTs and was told to go to the ER. Patient does have elevated LFTs today.  Past Medical History  Diagnosis Date  . Neck mass   . Clotting disorder   . Sickle cell trait   . Pulmonary embolism   . Cancer     Past Surgical History  Procedure Date  . Surgery scrotal / testicular     History reviewed. No pertinent family history.  Social History:  reports that he has been smoking.  He does not have any smokeless tobacco history on file. He reports that he drinks about 6 ounces of alcohol per week. He reports that he uses illicit drugs (Marijuana).  Allergies: No Known Allergies  Medications: I have reviewed the patient's current medications.  Results for orders placed during the hospital encounter of 02/09/12 (from the past 48 hour(s))  CBC WITH DIFFERENTIAL     Status: Normal   Collection Time   02/09/12 12:53 PM      Component Value Range Comment   WBC 4.9  4.0 - 10.5 K/uL    RBC 5.74  4.22 - 5.81 MIL/uL    Hemoglobin 15.3  13.0 - 17.0 g/dL    HCT 16.1  09.6 - 04.5 %    MCV 78.2  78.0 - 100.0 fL    MCH 26.7  26.0 - 34.0 pg    MCHC 34.1  30.0 - 36.0 g/dL    RDW 40.9  81.1 - 91.4 %    Platelets 183  150 - 400 K/uL    Neutrophils Relative 66  43 - 77 %    Neutro Abs 3.2  1.7 - 7.7 K/uL    Lymphocytes Relative 22  12 - 46 %    Lymphs Abs 1.1  0.7 - 4.0 K/uL    Monocytes Relative 8  3 - 12 %    Monocytes Absolute 0.4  0.1 - 1.0 K/uL    Eosinophils Relative 3  0 - 5 %    Eosinophils Absolute 0.2  0.0 - 0.7 K/uL    Basophils Relative 1  0 - 1 %    Basophils Absolute 0.1  0.0 - 0.1 K/uL   COMPREHENSIVE METABOLIC PANEL     Status: Abnormal   Collection Time   02/09/12 12:53 PM      Component Value Range Comment   Sodium 141  135 - 145 mEq/L    Potassium 3.5  3.5 - 5.1 mEq/L    Chloride 103  96 - 112 mEq/L    CO2 24  19 - 32 mEq/L    Glucose, Bld 81  70 - 99 mg/dL    BUN 5 (*) 6 - 23 mg/dL    Creatinine, Ser 7.82  0.50 - 1.35 mg/dL    Calcium  9.9  8.4 - 10.5 mg/dL    Total Protein 8.0  6.0 - 8.3 g/dL    Albumin 4.2  3.5 - 5.2 g/dL    AST 161 (*) 0 - 37 U/L    ALT 907 (*) 0 - 53 U/L    Alkaline Phosphatase 127 (*) 39 - 117 U/L    Total Bilirubin 5.4 (*) 0.3 - 1.2 mg/dL    GFR calc non Af Amer >90  >90 mL/min    GFR calc Af Amer >90  >90 mL/min   PROTIME-INR     Status: Normal   Collection Time   02/09/12 12:53 PM      Component Value Range Comment   Prothrombin Time 12.4  11.6 - 15.2 seconds    INR 0.93  0.00 - 1.49   LIPASE, BLOOD     Status: Normal   Collection Time   02/09/12 12:53 PM      Component Value Range Comment   Lipase 15  11 - 59 U/L   ACETAMINOPHEN LEVEL     Status: Normal   Collection Time   02/09/12 12:53 PM      Component Value Range Comment   Acetaminophen (Tylenol), Serum <15.0  10 - 30 ug/mL   SALICYLATE LEVEL     Status: Abnormal   Collection Time   02/09/12 12:53 PM      Component Value Range Comment   Salicylate Lvl <2.0 (*) 2.8 - 20.0 mg/dL   ETHANOL     Status: Normal   Collection Time   02/09/12 12:53 PM      Component Value Range Comment   Alcohol, Ethyl (B) <11  0 - 11 mg/dL   APTT     Status: Normal   Collection Time   02/09/12 12:53 PM      Component Value  Range Comment   aPTT 34  24 - 37 seconds   URINALYSIS, ROUTINE W REFLEX MICROSCOPIC     Status: Abnormal   Collection Time   02/09/12  2:20 PM      Component Value Range Comment   Color, Urine ORANGE (*) YELLOW BIOCHEMICALS MAY BE AFFECTED BY COLOR   APPearance CLEAR  CLEAR    Specific Gravity, Urine 1.012  1.005 - 1.030    pH 6.0  5.0 - 8.0    Glucose, UA NEGATIVE  NEGATIVE mg/dL    Hgb urine dipstick NEGATIVE  NEGATIVE    Bilirubin Urine LARGE (*) NEGATIVE    Ketones, ur 40 (*) NEGATIVE mg/dL    Protein, ur NEGATIVE  NEGATIVE mg/dL    Urobilinogen, UA 2.0 (*) 0.0 - 1.0 mg/dL    Nitrite NEGATIVE  NEGATIVE    Leukocytes, UA NEGATIVE  NEGATIVE MICROSCOPIC NOT DONE ON URINES WITH NEGATIVE PROTEIN, BLOOD, LEUKOCYTES, NITRITE, OR GLUCOSE <1000 mg/dL.  TROPONIN I     Status: Normal   Collection Time   02/09/12  3:45 PM      Component Value Range Comment   Troponin I <0.30  <0.30 ng/mL   POCT I-STAT, CHEM 8     Status: Abnormal   Collection Time   02/09/12  3:57 PM      Component Value Range Comment   Sodium 142  135 - 145 mEq/L    Potassium 3.5  3.5 - 5.1 mEq/L    Chloride 106  96 - 112 mEq/L    BUN <3 (*) 6 - 23 mg/dL    Creatinine, Ser 0.96  0.50 - 1.35 mg/dL  Glucose, Bld 128 (*) 70 - 99 mg/dL    Calcium, Ion 1.61  0.96 - 1.23 mmol/L    TCO2 25  0 - 100 mmol/L    Hemoglobin 16.3  13.0 - 17.0 g/dL    HCT 04.5  40.9 - 81.1 %   POCT I-STAT TROPONIN I     Status: Normal   Collection Time   02/09/12  3:57 PM      Component Value Range Comment   Troponin i, poc 0.00  0.00 - 0.08 ng/mL    Comment 3            URINE RAPID DRUG SCREEN (HOSP PERFORMED)     Status: Abnormal   Collection Time   02/09/12  4:34 PM      Component Value Range Comment   Opiates NONE DETECTED  NONE DETECTED    Cocaine NONE DETECTED  NONE DETECTED    Benzodiazepines NONE DETECTED  NONE DETECTED    Amphetamines NONE DETECTED  NONE DETECTED    Tetrahydrocannabinol POSITIVE (*) NONE DETECTED    Barbiturates  NONE DETECTED  NONE DETECTED   GLUCOSE, CAPILLARY     Status: Abnormal   Collection Time   02/09/12  4:48 PM      Component Value Range Comment   Glucose-Capillary 127 (*) 70 - 99 mg/dL     Dg Chest 2 View  10/07/7827  *RADIOLOGY REPORT*  Clinical Data: Chest pain, weakness  CHEST - 2 VIEW  Comparison: None.  Findings: Cardiomediastinal silhouette is unremarkable.  No acute infiltrate or pleural effusion.  No pulmonary edema.  Bony thorax is unremarkable.  IMPRESSION: No active disease.   Original Report Authenticated By: Natasha Mead, M.D.    Ct Head Wo Contrast  02/09/2012  *RADIOLOGY REPORT*  Clinical Data: Left-sided weakness. Code stroke.  CT HEAD WITHOUT CONTRAST  Technique:  Contiguous axial images were obtained from the base of the skull through the vertex without contrast.  Comparison: None.  Findings: Normal appearance of the intracranial structures.  There is no evidence for acute hemorrhage, mass lesion, midline shift, hydrocephalus or large infarct.  There is deformity of the right medial orbital that probably represents old trauma.  Frontal sinuses are absent.  IMPRESSION: No acute intracranial abnormality.  These results were called by telephone on 02/09/2012 at 3:58 p.m. to Dr. Leroy Kennedy, who verbally acknowledged these results.   Original Report Authenticated By: Richarda Overlie, M.D.    US Abdomen Complete  02/09/2012  *RADIOLOGY REPORT*  Clinical Data:  Elevated liver function tests.  COMPLETE ABDOMINAL ULTRASOUND  Comparison:  06/21/2007  Findings:  Gallbladder:  A "wall echo shadow complex".  No pericholecystic fluid.  Wall thickness upper normal, 3 mm.  The patient is tender over the gallbladder, but sonographic Murphy's sign was not elicited.  Common bile duct: Normal, 5 mm.  Liver: Mildly increased in echogenicity with mildly heterogeneous echotexture.  IVC: Negative  Pancreas:  Partially obscured pancreatic tail secondary bowel gas.  Spleen:  Normal in size and echogenicity.  Right Kidney:   13.4 cm. No hydronephrosis.  Left Kidney:  13.1 cm. No hydronephrosis.  Abdominal aorta:  Nonaneurysmal without ascites.  IMPRESSION: "Wall echo shadow" complex, most consistent with a stone filled gallbladder.  The largest stone measures 1.5 cm.  Although the patient is tender in the region of the gallbladder, no specific evidence of acute cholecystitis is identified.  Heterogeneous hepatic steatosis.   Original Report Authenticated By: Jeronimo Greaves, M.D.     Review of  Systems  Constitutional: Negative.   HENT: Negative.   Respiratory: Negative.   Cardiovascular: Negative.   Gastrointestinal: Positive for abdominal pain (RUQ).  Musculoskeletal: Negative.   Neurological: Negative.    Blood pressure 138/89, pulse 83, temperature 98.9 F (37.2 C), temperature source Oral, resp. rate 16, height 5\' 7"  (1.702 m), weight 246 lb 14.6 oz (112 kg), SpO2 96.00%. Physical Exam  Constitutional: He is oriented to person, place, and time. He appears well-developed and well-nourished.  HENT:  Head: Normocephalic and atraumatic.  Eyes: Conjunctivae normal and EOM are normal. Pupils are equal, round, and reactive to light.  Neck: Normal range of motion. Neck supple.  Cardiovascular: Normal rate and normal heart sounds.   Respiratory: Effort normal and breath sounds normal.  GI: Soft. Bowel sounds are normal. There is tenderness (RUq).  Neurological: He is alert and oriented to person, place, and time.    Assessment/Plan: This is a 42 year old male with likely acetaminophen toxicity leading to hepatitis. Considering the patient's normal ultrasound and lack of white count or nothing the patient has acute cholecystitis.  The patient is to be admitted to the medical service and treated for this high acetaminophen levels. There are no current surgical issues at this time and no plan for cholecystectomy. Patient follow up as an outpatient should he have right upper quadrant pain after his hepatitis has been  resolved.  I recommend GI consult should his LFTs continue to elevate.  Please call if can be of further assistance.  Marigene Ehlers., Marchelle Rinella 02/09/2012, 5:55 PM

## 2012-02-10 ENCOUNTER — Inpatient Hospital Stay (HOSPITAL_COMMUNITY): Payer: BC Managed Care – PPO

## 2012-02-10 DIAGNOSIS — F329 Major depressive disorder, single episode, unspecified: Secondary | ICD-10-CM

## 2012-02-10 DIAGNOSIS — T391X1A Poisoning by 4-Aminophenol derivatives, accidental (unintentional), initial encounter: Secondary | ICD-10-CM

## 2012-02-10 DIAGNOSIS — K219 Gastro-esophageal reflux disease without esophagitis: Secondary | ICD-10-CM

## 2012-02-10 LAB — COMPREHENSIVE METABOLIC PANEL
ALT: 608 U/L — ABNORMAL HIGH (ref 0–53)
AST: 206 U/L — ABNORMAL HIGH (ref 0–37)
AST: 182 U/L — ABNORMAL HIGH (ref 0–37)
Albumin: 3.6 g/dL (ref 3.5–5.2)
CO2: 22 mEq/L (ref 19–32)
Calcium: 9.4 mg/dL (ref 8.4–10.5)
Chloride: 103 mEq/L (ref 96–112)
Creatinine, Ser: 0.57 mg/dL (ref 0.50–1.35)
GFR calc Af Amer: >90 mL/min (ref >90)
GFR calc non Af Amer: >90 mL/min (ref >90)
Glucose, Bld: 115 mg/dL — ABNORMAL HIGH (ref 70–99)
Sodium: 140 mEq/L (ref 135–145)
Total Bilirubin: 10.5 mg/dL — ABNORMAL HIGH (ref 0.3–1.2)
Total Protein: 7.3 g/dL (ref 6.0–8.3)

## 2012-02-10 LAB — HEMOGLOBIN A1C: Hgb A1c MFr Bld: 5.1 % (ref <5.7)

## 2012-02-10 LAB — CBC
MCH: 27.4 pg (ref 26.0–34.0)
MCHC: 35.1 g/dL (ref 30.0–36.0)
Platelets: 172 10*3/uL (ref 150–400)
RBC: 5.43 MIL/uL (ref 4.22–5.81)
RDW: 13.5 % (ref 11.5–15.5)

## 2012-02-10 LAB — TROPONIN I
Troponin I: <0.3 ng/mL (ref <0.30)
Troponin I: <0.3 ng/mL (ref <0.30)

## 2012-02-10 LAB — LIPID PANEL
HDL: 23 mg/dL — ABNORMAL LOW (ref >39)
LDL Cholesterol: 138 mg/dL — ABNORMAL HIGH (ref 0–99)
Total CHOL/HDL Ratio: 8.9 RATIO

## 2012-02-10 LAB — TSH: TSH: 1.033 u[IU]/mL (ref 0.350–4.500)

## 2012-02-10 LAB — PROTIME-INR: INR: 1.07 (ref 0.00–1.49)

## 2012-02-10 MED ORDER — PANTOPRAZOLE SODIUM 40 MG PO TBEC
40.0000 mg | DELAYED_RELEASE_TABLET | Freq: Two times a day (BID) | ORAL | Status: DC
  Administered 2012-02-10 – 2012-02-12 (×5): 40 mg via ORAL
  Filled 2012-02-10 (×6): qty 1

## 2012-02-10 NOTE — Progress Notes (Signed)
MEDICATION RELATED CONSULT NOTE - FOLLOW UP   Pharmacy Consult for Acetylcysteine Indication: Suspected Tylenol Overdose (chronic)  No Known Allergies  Patient Measurements: Height: 5\' 7"  (170.2 cm) Weight: 250 lb (113.4 kg) IBW/kg (Calculated) : 66.1  Adjusted Body Weight:   Vital Signs: Temp: 98.3 F (36.8 C) (01/18 0600) BP: 134/85 mmHg (01/18 0600) Pulse Rate: 79  (01/18 0600) Intake/Output from previous day: 01/17 0701 - 01/18 0700 In: 400 [I.V.:350; IV Piggyback:50] Out: 1800 [Urine:1800] Intake/Output from this shift: Total I/O In: -  Out: 900 [Urine:900]  Labs:  [facility] 02/10/12 0950 02/10/12 0615 02/09/12 2003 02/09/12 1557 02/09/12 1253  WBC -- 7.2 5.5 -- 4.9  HGB -- 14.9 14.8 16.3 --  HCT -- 42.4 41.4 48.0 --  PLT -- 172 158 -- 183  APTT -- -- -- -- 34  CREATININE 0.57 0.62 0.56 -- --  LABCREA -- -- -- -- --  CREATININE 0.57 0.62 0.56 -- --  CREAT24HRUR -- -- -- -- --  MG -- -- -- -- --  PHOS -- -- -- -- --  ALBUMIN 3.4* 3.6 -- -- 4.2  PROT 6.7 7.3 -- -- 8.0  ALBUMIN 3.4* 3.6 -- -- 4.2  AST 182* 206* -- -- 356*  ALT 570* 608* -- -- 907*  ALKPHOS 91 99 -- -- 127*  BILITOT 10.5* 9.9* -- -- 5.4*  BILIDIR -- -- -- -- --  IBILI -- -- -- -- --   Estimated Creatinine Clearance: 149 ml/min (by C-G formula based on Cr of 0.57).   Assessment: 42 y.o. male presented to ED today with 1 wk h/o N/V, chills, neck/back pain. Noted pt to Urgent Care yesterday and had elevated hepatic enzymes. 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 Patient was started on Acetylcysteine last night with bolus and continues on 15 mg/kg/hr infusion. Poison Control was contacted and recommended to recheck labs ~23 hours after infusion started.   Follow-up labs have resulted (~22 hours post infusion). AST/ALT have continued to trend down, INR has remained stable (<2)  and Acetaminophen level remains <15. RN reports patient has not had any nausea or vomiting this AM. Reported labs to Poison Control - received recommendation to stop Acetylcysteine and continue to monitor patient/labs. Discussed labs and recommendations with Dr. Gwenlyn Perking - received verbal order to discontinue Acetylcysteine.  Goal of Therapy:  1) Patient is asymptomatic  2) AST and ALT normal  3) Serum bicarbonate ? 20 or pH ? 7.25  4) INR < 2 (if required)  5) Acetaminophen level ? 10 mcg/mL   Plan:  1. Discontinue Acetylcysteine infusion 2. Continue to monitor LFTs, INR, and patient status  Cleon Dew [phone] 02/10/2012,12:37 PM

## 2012-02-10 NOTE — Progress Notes (Signed)
Subjective: Pt feels about the same  Objective: Vital signs in last 24 hours: Temp:  [98.3 F (36.8 C)-98.9 F (37.2 C)] 98.3 F (36.8 C) (01/18 0600) Pulse Rate:  [73-91] 79  (01/18 0600) Resp:  [16-22] 18  (01/18 0600) BP: (134-174)/(85-118) 134/85 mmHg (01/18 0600) SpO2:  [95 %-99 %] 99 % (01/18 0600) Weight:  [246 lb 14.6 oz (112 kg)-250 lb (113.4 kg)] 250 lb (113.4 kg) (01/17 1921)    Intake/Output from previous day: 01/17 0701 - 01/18 0700 In: 400 [I.V.:350; IV Piggyback:50] Out: 1800 [Urine:1800] Intake/Output this shift:    GI: still tender in ruq  Lab Results:   Basename 02/10/12 0615 02/09/12 2003  WBC 7.2 5.5  HGB 14.9 14.8  HCT 42.4 41.4  PLT 172 158   BMET  Basename 02/10/12 0615 02/09/12 2003 02/09/12 1557 02/09/12 1253  NA 140 -- 142 --  K 3.7 -- 3.5 --  CL 105 -- 106 --  CO2 23 -- -- 24  GLUCOSE 138* -- 128* --  BUN 5* -- <3* --  CREATININE 0.62 0.56 -- --  CALCIUM 9.4 -- -- 9.9   PT/INR  Basename 02/09/12 1253  LABPROT 12.4  INR 0.93   ABG No results found for this basename: PHART:2,PCO2:2,PO2:2,HCO3:2 in the last 72 hours  Studies/Results: Dg Chest 2 View  02/09/2012  *RADIOLOGY REPORT*  Clinical Data: Chest pain, weakness  CHEST - 2 VIEW  Comparison: None.  Findings: Cardiomediastinal silhouette is unremarkable.  No acute infiltrate or pleural effusion.  No pulmonary edema.  Bony thorax is unremarkable.  IMPRESSION: No active disease.   Original Report Authenticated By: Natasha Mead, M.D.    Ct Head Wo Contrast  02/09/2012  *RADIOLOGY REPORT*  Clinical Data: Left-sided weakness. Code stroke.  CT HEAD WITHOUT CONTRAST  Technique:  Contiguous axial images were obtained from the base of the skull through the vertex without contrast.  Comparison: None.  Findings: Normal appearance of the intracranial structures.  There is no evidence for acute hemorrhage, mass lesion, midline shift, hydrocephalus or large infarct.  There is deformity of the  right medial orbital that probably represents old trauma.  Frontal sinuses are absent.  IMPRESSION: No acute intracranial abnormality.  These results were called by telephone on 02/09/2012 at 3:58 p.m. to Dr. Leroy Kennedy, who verbally acknowledged these results.   Original Report Authenticated By: Richarda Overlie, M.D.    US Abdomen Complete  02/09/2012  *RADIOLOGY REPORT*  Clinical Data:  Elevated liver function tests.  COMPLETE ABDOMINAL ULTRASOUND  Comparison:  06/21/2007  Findings:  Gallbladder:  A "wall echo shadow complex".  No pericholecystic fluid.  Wall thickness upper normal, 3 mm.  The patient is tender over the gallbladder, but sonographic Murphy's sign was not elicited.  Common bile duct: Normal, 5 mm.  Liver: Mildly increased in echogenicity with mildly heterogeneous echotexture.  IVC: Negative  Pancreas:  Partially obscured pancreatic tail secondary bowel gas.  Spleen:  Normal in size and echogenicity.  Right Kidney:  13.4 cm. No hydronephrosis.  Left Kidney:  13.1 cm. No hydronephrosis.  Abdominal aorta:  Nonaneurysmal without ascites.  IMPRESSION: "Wall echo shadow" complex, most consistent with a stone filled gallbladder.  The largest stone measures 1.5 cm.  Although the patient is tender in the region of the gallbladder, no specific evidence of acute cholecystitis is identified.  Heterogeneous hepatic steatosis.   Original Report Authenticated By: Jeronimo Greaves, M.D.    Ct Abdomen Pelvis W Contrast  02/09/2012  *RADIOLOGY REPORT*  Clinical  Data: Right upper quadrant pain and elevated liver enzymes.  Nausea.  History of testicular cancer.  CT ABDOMEN AND PELVIS WITH CONTRAST  Technique:  Multidetector CT imaging of the abdomen and pelvis was performed following the standard protocol during bolus administration of intravenous contrast.  Contrast: OMNIPAQUE IOHEXOL 300 MG/ML  SOLN  Comparison: Ultrasound 02/09/2012.  Findings: Minimal dependent atelectasis in the lung bases.  Diffuse low attenuation  change in the liver consistent with fatty infiltration.  No focal lesions are identified.  There is mild gallbladder wall thickening.  Cholecystitis is not excluded.  The stones seen on ultrasound are not identified by CT.  No bile duct dilatation.  The pancreas, adrenal glands, spleen, abdominal aorta, and retroperitoneal lymph nodes are unremarkable.  Sub centimeter parenchymal cysts in both kidneys.  No solid mass or hydronephrosis is identified.  The stomach, small bowel, and colon are decompressed.  Contrast material does flow through to the colon suggesting no evidence of obstruction.  No free air or free fluid in the abdomen.  Pelvis:  The appendix is normal.  Scattered diverticula in the sigmoid colon without diverticulitis.  Prostate gland is not enlarged.  The bladder wall appears thickened but this may be due to under distension.  Tenting of the anterior bladder with linear scarring to the umbilicus may suggest urachal remnant.  No significant lymphadenopathy in the pelvis.  No free or loculated pelvic fluid collections.  IMPRESSION: Diffuse fatty infiltration of the liver.  Mild gallbladder wall edema may suggest cholecystitis.  Stones are not visualized. 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  No evidence of metastatic disease.   Original Report Authenticated By: Burman Nieves, M.D.     Anti-infectives: Anti-infectives     Start     Dose/Rate Route Frequency Ordered Stop   02/09/12 2000   piperacillin-tazobactam (ZOSYN) IVPB 3.375 g        3.375 g 12.5 mL/hr over 240 Minutes Intravenous Every 8 hours 02/09/12 1904            Assessment/Plan: s/p * No surgery found * Probable tylenol toxicity Continue bowel rest. His bilirubin continues to elevate so I would recommend GI consult. No plan for surgery until his liver has recovered  LOS: 1 day    TOTH III,Ellianne Gowen S 02/10/2012

## 2012-02-10 NOTE — Progress Notes (Addendum)
[facility] PROGRESS NOTE  Mike Webb ZOX:[id number] DOB: 23-Nov-1970 DOA: 02/09/2012 PCP: No primary provider on file.  Assessment/Plan: 1. Right upper quadrant abdominal pain. Elevated liver enzymes. Liberal use of acetaminophen in the last week. -LFT's trending down today, patient with flu like symptoms -Continue acetadote and supportive care as per protocol -check hepatitis panel, ALT mainly elevated and patient complaining of flu like symptoms. -Base on results will involved GI if needed; but this could be just secondary to acetaminophen  -continue bowel rest and antibiotics -no surgery for now  2. Episode of left-sided weakness transient in the ER which has since resolved. This could be complex migraine versus TIA - head CT stable. -continue ASA -follow TIA workup -no neurologic deficit   3. History of sickle cell trait and PE in the past. No acute issues will continue IVF's and heparin for DVT prophylaxis.   4. Nonspecific EKG change found incidentally. Patient is chest pain-free. Cardiac enzymes negative; will follow 2-d echo.  5.Fatty liver and transaminitis:workup as mentioned on #1; instructed to lose weight and follow low fat diet.  6.GERD: continue PPI  7.Depression: continue lexapro  DVt: heparin.  Code Status: Full Family Communication: no family at bedside Disposition Plan: follow results of test ordered, home when medically stable   Consultants:  Surgery  neurology  Procedures:  Se below for x-ray reports  2-d echo and carotid dopplers (pending)  Antibiotics:  zosyn  HPI/Subjective: Afebrile, complaining of abdominal pain, especially after eating last night despite been admitted NPO for mild cholecystitis.  Objective: Filed Vitals:   02/09/12 1830 02/09/12 1921 02/09/12 2230 02/10/12 0600  BP: 145/93 150/97 147/91 134/85  Pulse: 82 77 73 79  Temp:  98.5 F (36.9 C) 98.5 F (36.9 C) 98.3 F (36.8 C)  TempSrc:  Oral    Resp:  16  16 18   Height:  5\' 7"  (1.702 m)    Weight:  113.4 kg (250 lb)    SpO2: 95% 95% 96% 99%    Intake/Output Summary (Last 24 hours) at 02/10/12 1029 Last data filed at 02/10/12 0601  Gross per 24 hour  Intake    400 ml  Output   1800 ml  Net  -1400 ml   Filed Weights   02/09/12 1300 02/09/12 1921  Weight: 112 kg (246 lb 14.6 oz) 113.4 kg (250 lb)    Exam:   General:  NAD, afebrile, complaining of abdominal discomfort, heartburn and bloating  Cardiovascular: regular rate and rhythm, no rubs or gallops  Respiratory: CTA  Abdomen: RUQ tenderness on palpation, positive BS, no guarding  Neuro: non focal deficit today.  Data Reviewed: Basic Metabolic Panel:  Lab 02/10/12 8119 02/09/12 2003 02/09/12 1557 02/09/12 1253  NA 140 -- 142 141  K 3.7 -- 3.5 3.5  CL 105 -- 106 103  CO2 23 -- -- 24  GLUCOSE 138* -- 128* 81  BUN 5* -- <3* 5*  CREATININE 0.62 0.56 1.00 0.72  CALCIUM 9.4 -- -- 9.9  MG -- -- -- --  PHOS -- -- -- --   Liver Function Tests:  Lab 02/10/12 0615 02/09/12 1253  AST 206* 356*  ALT 608* 907*  ALKPHOS 99 127*  BILITOT 9.9* 5.4*  PROT 7.3 8.0  ALBUMIN 3.6 4.2    Lab 02/09/12 1253  LIPASE 15  AMYLASE --   CBC:  Lab 02/10/12 0615 02/09/12 2003 02/09/12 1557 02/09/12 1253  WBC 7.2 5.5 -- 4.9  NEUTROABS -- -- -- 3.2  HGB  14.9 14.8 16.3 15.3  HCT 42.4 41.4 48.0 44.9  MCV 78.1 77.4* -- 78.2  PLT 172 158 -- 183   Cardiac Enzymes:  Lab 02/10/12 0052 02/09/12 1952 02/09/12 1545  CKTOTAL -- -- --  CKMB -- -- --  CKMBINDEX -- -- --  TROPONINI <0.30 <0.30 <0.30   CBG:  Lab 02/09/12 1648  GLUCAP 127*    Studies: Dg Chest 2 View  02/09/2012  *RADIOLOGY REPORT*  Clinical Data: Chest pain, weakness  CHEST - 2 VIEW  Comparison: None.  Findings: Cardiomediastinal silhouette is unremarkable.  No acute infiltrate or pleural effusion.  No pulmonary edema.  Bony thorax is unremarkable.  IMPRESSION: No active disease.   Original Report Authenticated By:  Natasha Mead, M.D.    Ct Head Wo Contrast  02/09/2012  *RADIOLOGY REPORT*  Clinical Data: Left-sided weakness. Code stroke.  CT HEAD WITHOUT CONTRAST  Technique:  Contiguous axial images were obtained from the base of the skull through the vertex without contrast.  Comparison: None.  Findings: Normal appearance of the intracranial structures.  There is no evidence for acute hemorrhage, mass lesion, midline shift, hydrocephalus or large infarct.  There is deformity of the right medial orbital that probably represents old trauma.  Frontal sinuses are absent.  IMPRESSION: No acute intracranial abnormality.  These results were called by telephone on 02/09/2012 at 3:58 p.m. to Dr. Leroy Kennedy, who verbally acknowledged these results.   Original Report Authenticated By: Richarda Overlie, M.D.    US Abdomen Complete  02/09/2012  *RADIOLOGY REPORT*  Clinical Data:  Elevated liver function tests.  COMPLETE ABDOMINAL ULTRASOUND  Comparison:  06/21/2007  Findings:  Gallbladder:  A "wall echo shadow complex".  No pericholecystic fluid.  Wall thickness upper normal, 3 mm.  The patient is tender over the gallbladder, but sonographic Murphy's sign was not elicited.  Common bile duct: Normal, 5 mm.  Liver: Mildly increased in echogenicity with mildly heterogeneous echotexture.  IVC: Negative  Pancreas:  Partially obscured pancreatic tail secondary bowel gas.  Spleen:  Normal in size and echogenicity.  Right Kidney:  13.4 cm. No hydronephrosis.  Left Kidney:  13.1 cm. No hydronephrosis.  Abdominal aorta:  Nonaneurysmal without ascites.  IMPRESSION: "Wall echo shadow" complex, most consistent with a stone filled gallbladder.  The largest stone measures 1.5 cm.  Although the patient is tender in the region of the gallbladder, no specific evidence of acute cholecystitis is identified.  Heterogeneous hepatic steatosis.   Original Report Authenticated By: Jeronimo Greaves, M.D.    Ct Abdomen Pelvis W Contrast  02/09/2012  *RADIOLOGY REPORT*   Clinical Data: Right upper quadrant pain and elevated liver enzymes.  Nausea.  History of testicular cancer.  CT ABDOMEN AND PELVIS WITH CONTRAST  Technique:  Multidetector CT imaging of the abdomen and pelvis was performed following the standard protocol during bolus administration of intravenous contrast.  Contrast: OMNIPAQUE IOHEXOL 300 MG/ML  SOLN  Comparison: Ultrasound 02/09/2012.  Findings: Minimal dependent atelectasis in the lung bases.  Diffuse low attenuation change in the liver consistent with fatty infiltration.  No focal lesions are identified.  There is mild gallbladder wall thickening.  Cholecystitis is not excluded.  The stones seen on ultrasound are not identified by CT.  No bile duct dilatation.  The pancreas, adrenal glands, spleen, abdominal aorta, and retroperitoneal lymph nodes are unremarkable.  Sub centimeter parenchymal cysts in both kidneys.  No solid mass or hydronephrosis is identified.  The stomach, small bowel, and colon are decompressed.  Contrast  material does flow through to the colon suggesting no evidence of obstruction.  No free air or free fluid in the abdomen.  Pelvis:  The appendix is normal.  Scattered diverticula in the sigmoid colon without diverticulitis.  Prostate gland is not enlarged.  The bladder wall appears thickened but this may be due to under distension.  Tenting of the anterior bladder with linear scarring to the umbilicus may suggest urachal remnant.  No significant lymphadenopathy in the pelvis.  No free or loculated pelvic fluid collections.  IMPRESSION: Diffuse fatty infiltration of the liver.  Mild gallbladder wall edema may suggest cholecystitis.  Stones are not visualized. 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  No evidence of metastatic disease.   Original Report Authenticated By: Burman Nieves, M.D.     Scheduled Meds:   . aspirin  81 mg Oral Daily  . clorazepate  7.5 mg Oral BID  . escitalopram  10 mg Oral Daily  . heparin  5,000  Units Subcutaneous Q8H  . pantoprazole  40 mg Oral BID  . piperacillin-tazobactam (ZOSYN)  IV  3.375 g Intravenous Q8H  . sodium chloride  3 mL Intravenous Q12H   Continuous Infusions:   . sodium chloride 125 mL/hr at 02/10/12 0500  . acetylcysteine 15 mg/kg/hr (02/10/12 4098)    Time spent: >30 minutes    Skyann Ganim  [facility] Pager [phone]. If 8PM-8AM, please contact night-coverage at [email or web address], password [facility] 02/10/2012, 10:29 AM  LOS: 1 day

## 2012-02-10 NOTE — Evaluation (Signed)
Physical Therapy Evaluation Patient Details Name: Mike Webb MRN: [id number] DOB: 12/18/1970 Today's Date: 02/10/2012 Time: 7564-3329 PT Time Calculation (min): 21 min  PT Assessment / Plan / Recommendation Clinical Impression  Patient is a 42 yo male admitted with abdominal pain who had sudden left sided weakness in the ED lasting 10 minutes.  Patient's NIHSS score was 3 at that time.  Weakness has resolved.  However patient is describing light sensitivity and spinning feelings associated with headache.  Will benefit from PT to maximize independence/safety with mobility and gait.    PT Assessment  Patient needs continued PT services    Follow Up Recommendations  No PT follow up;Supervision - Intermittent    Does the patient have the potential to tolerate intense rehabilitation      Barriers to Discharge Decreased caregiver support      Equipment Recommendations  None recommended by PT    Recommendations for Other Services     Frequency Min 4X/week    Precautions / Restrictions Precautions Precautions: None Restrictions Weight Bearing Restrictions: No   Pertinent Vitals/Pain       Mobility  Bed Mobility Bed Mobility: Supine to Sit;Sitting - Scoot to Edge of Bed;Sit to Supine Supine to Sit: 5: Supervision;HOB elevated Sitting - Scoot to Edge of Bed: 6: Modified independent (Device/Increase time);With rail Sit to Supine: 5: Supervision;HOB elevated Details for Bed Mobility Assistance: Verbal cues to move slowly through transitions.  No assist needed. Transfers Transfers: Sit to Stand;Stand to Sit Sit to Stand: 4: Min guard;From bed Stand to Sit: 4: Min guard;To bed Details for Transfer Assistance: Verbal cues for safe hand placement.  Patient reports he feels like he is spinning.  Instructed patient to stand for several seconds before ambulation for safety. Ambulation/Gait Ambulation/Gait Assistance: 4: Min guard Ambulation Distance (Feet): 132 Feet Assistive  device: None Ambulation/Gait Assistance Details: Patient required 2 standing rest breaks due to dizziness.  Assist for safety/balance. Gait Pattern: Within Functional Limits Gait velocity: Slow gait speed Modified Rankin (Stroke Patients Only) Pre-Morbid Rankin Score: No symptoms Modified Rankin: No significant disability       Exercises Other Exercises Other Exercises: Had patient focus on object x 60 seconds to reduce dizziness.  Difficult due to light sensitivity.   PT Diagnosis: Abnormality of gait (Dizziness)  PT Problem List: Decreased activity tolerance;Decreased mobility;Pain (Dizziness) PT Treatment Interventions: Gait training;Functional mobility training;Therapeutic exercise;Balance training;Patient/family education   PT Goals Acute Rehab PT Goals PT Goal Formulation: With patient Time For Goal Achievement: 02/17/12 Potential to Achieve Goals: Good Pt will go Sit to Stand: Independently PT Goal: Sit to Stand - Progress: Goal set today Pt will go Stand to Sit: Independently PT Goal: Stand to Sit - Progress: Goal set today Pt will Ambulate: >150 feet;Independently (without loss of balance) PT Goal: Ambulate - Progress: Goal set today Pt will Go Up / Down Stairs: 3-5 stairs;Independently;with rail(s) PT Goal: Up/Down Stairs - Progress: Goal set today Additional Goals Additional Goal #1: Patient will complete all above goals with dizziness < 2/5 PT Goal: Additional Goal #1 - Progress: Goal set today  Visit Information  Last PT Received On: 02/10/12 Assistance Needed: +1    Subjective Data  Subjective: "I feel like the room is spinning" Patient Stated Goal: To have no pain.  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) Musician: No difficulties Dominant Hand: Right    Cognition  Overall Cognitive Status: Appears within functional limits for tasks assessed/performed Arousal/Alertness: Awake/alert Orientation Level: Oriented X4 / Intact Behavior During Session: WFL for tasks performed    Extremity/Trunk Assessment Right Upper Extremity Assessment RUE ROM/Strength/Tone: Within functional levels RUE Sensation: WFL - Light Touch Left Upper Extremity Assessment LUE ROM/Strength/Tone: Within functional levels LUE Sensation: WFL - Light Touch Right Lower Extremity Assessment RLE ROM/Strength/Tone: Within functional levels RLE Sensation: WFL - Light Touch RLE Coordination: WFL - gross motor Left Lower Extremity Assessment LLE ROM/Strength/Tone: Within functional levels LLE Sensation: WFL - Light Touch LLE Coordination: WFL - gross motor Trunk Assessment Trunk Assessment: Normal   Balance    End of Session PT - End of Session Equipment Utilized During Treatment: Gait belt Activity Tolerance: Treatment limited secondary to medical complications (Comment) (Limited by dizziness and light sensitivity) Patient left: in bed;with call bell/phone within reach Nurse Communication: Mobility status (Issues with dizziness/spinning)  GP     Vena Austria 02/10/2012, 10:23 AM Durenda Hurt. Renaldo Fiddler, [facility] Acute Rehab Services Pager [phone]

## 2012-02-11 DIAGNOSIS — I2699 Other pulmonary embolism without acute cor pulmonale: Secondary | ICD-10-CM

## 2012-02-11 DIAGNOSIS — D573 Sickle-cell trait: Secondary | ICD-10-CM

## 2012-02-11 LAB — COMPREHENSIVE METABOLIC PANEL
ALT: 489 U/L — ABNORMAL HIGH (ref 0–53)
AST: 160 U/L — ABNORMAL HIGH (ref 0–37)
CO2: 24 mEq/L (ref 19–32)
Calcium: 9.3 mg/dL (ref 8.4–10.5)
Creatinine, Ser: 0.71 mg/dL (ref 0.50–1.35)
GFR calc non Af Amer: >90 mL/min (ref >90)
Sodium: 139 mEq/L (ref 135–145)
Total Protein: 6.8 g/dL (ref 6.0–8.3)

## 2012-02-11 LAB — CBC
MCH: 26.7 pg (ref 26.0–34.0)
MCHC: 33.7 g/dL (ref 30.0–36.0)
MCV: 79.2 fL (ref 78.0–100.0)
Platelets: 190 10*3/uL (ref 150–400)
RBC: 4.76 MIL/uL (ref 4.22–5.81)
RDW: 13.2 % (ref 11.5–15.5)

## 2012-02-11 LAB — INFLUENZA PANEL BY PCR (TYPE A & B)
H1N1 flu by pcr: NOT DETECTED
Influenza B By PCR: NEGATIVE

## 2012-02-11 LAB — HEPATITIS PANEL, ACUTE
HCV Ab: NEGATIVE
Hep A IgM: NEGATIVE
Hepatitis B Surface Ag: NEGATIVE

## 2012-02-11 MED ORDER — CIPROFLOXACIN HCL 500 MG PO TABS
500.0000 mg | ORAL_TABLET | Freq: Two times a day (BID) | ORAL | Status: DC
  Administered 2012-02-12: 500 mg via ORAL
  Filled 2012-02-11 (×3): qty 1

## 2012-02-11 NOTE — Progress Notes (Signed)
Subjective: Pt with no acute changes.  Pt con't with RUQ pain that has not changed much.  Tol clears  Objective: Vital signs in last 24 hours: Temp:  [98.1 F (36.7 C)-98.7 F (37.1 C)] 98.1 F (36.7 C) (01/19 0400) Pulse Rate:  [69-75] 71  (01/19 0400) Resp:  [18-20] 20  (01/18 2130) BP: (121-137)/(71-88) 121/71 mmHg (01/19 0400) SpO2:  [97 %-99 %] 97 % (01/19 0400)    Intake/Output from previous day: 01/18 0701 - 01/19 0700 In: 480 [P.O.:480] Out: 2325 [Urine:2325] Intake/Output this shift: Total I/O In: 360 [P.O.:360] Out: 250 [Urine:250]  General appearance: alert and cooperative GI: soft, TTP RUQ, ND, + BS  Lab Results:   [facility] 02/11/12 0600 02/10/12 0615  WBC 6.2 7.2  HGB 12.7* 14.9  HCT 37.7* 42.4  PLT 190 172   BMET  Basename 02/11/12 0600 02/10/12 0950  NA 139 138  K 3.5 3.8  CL 104 103  CO2 24 22  GLUCOSE 97 115*  BUN 8 6  CREATININE 0.71 0.57  CALCIUM 9.3 9.0   PT/INR  Basename 02/10/12 0950 02/09/12 1253  LABPROT 13.8 12.4  INR 1.07 0.93   ABG No results found for this basename: PHART:2,PCO2:2,PO2:2,HCO3:2 in the last 72 hours  Studies/Results: Dg Chest 2 View  02/09/2012  *RADIOLOGY REPORT*  Clinical Data: Chest pain, weakness  CHEST - 2 VIEW  Comparison: None.  Findings: Cardiomediastinal silhouette is unremarkable.  No acute infiltrate or pleural effusion.  No pulmonary edema.  Bony thorax is unremarkable.  IMPRESSION: No active disease.   Original Report Authenticated By: Natasha Mead, M.D.    Ct Head Wo Contrast  02/09/2012  *RADIOLOGY REPORT*  Clinical Data: Left-sided weakness. Code stroke.  CT HEAD WITHOUT CONTRAST  Technique:  Contiguous axial images were obtained from the base of the skull through the vertex without contrast.  Comparison: None.  Findings: Normal appearance of the intracranial structures.  There is no evidence for acute hemorrhage, mass lesion, midline shift, hydrocephalus or large infarct.  There is deformity of  the right medial orbital that probably represents old trauma.  Frontal sinuses are absent.  IMPRESSION: No acute intracranial abnormality.  These results were called by telephone on 02/09/2012 at 3:58 p.m. to Dr. Leroy Kennedy, who verbally acknowledged these results.   Original Report Authenticated By: Richarda Overlie, M.D.    Mr Brain Wo Contrast  02/10/2012  *RADIOLOGY REPORT*  Clinical Data:  Sickle cell trait.  Brief episode of left-sided numbness and weakness lasting 10 minutes, now resolved. History of hypertension.  Possible posterior circulation TIA.  MRI HEAD WITHOUT CONTRAST MRA HEAD WITHOUT CONTRAST  Technique:  Multiplanar, multiecho pulse sequences of the brain and surrounding structures were obtained without intravenous contrast. Angiographic images of the head were obtained using MRA technique without contrast.  Comparison:  Normal CT head 02/09/2012.  MRI HEAD  Findings:  There is no evidence for acute infarction, intracranial hemorrhage, mass lesion, hydrocephalus, or extra-axial fluid. There is no atrophy or white matter disease.  No evidence for large vessel infarction.  No foci of chronic hemorrhage.  Normal orbits. 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  No air-fluid levels.  Negative mastoids. Unremarkable midline structures.  No worrisome osseous lesions.  IMPRESSION: No evidence for acute stroke or significant chronic cerebrovascular disease.  MRA HEAD  Findings: Widely patent internal carotid arteries and basilar artery.  Vertebrals codominant.  No intracranial stenosis or aneurysm.  IMPRESSION: Negative exam.   Original Report Authenticated  By: Davonna Belling, M.D.    US Abdomen Complete  02/09/2012  *RADIOLOGY REPORT*  Clinical Data:  Elevated liver function tests.  COMPLETE ABDOMINAL ULTRASOUND  Comparison:  06/21/2007  Findings:  Gallbladder:  A "wall echo shadow complex".  No pericholecystic fluid.  Wall thickness upper normal, 3 mm.  The patient is tender  over the gallbladder, but sonographic Murphy's sign was not elicited.  Common bile duct: Normal, 5 mm.  Liver: Mildly increased in echogenicity with mildly heterogeneous echotexture.  IVC: Negative  Pancreas:  Partially obscured pancreatic tail secondary bowel gas.  Spleen:  Normal in size and echogenicity.  Right Kidney:  13.4 cm. No hydronephrosis.  Left Kidney:  13.1 cm. No hydronephrosis.  Abdominal aorta:  Nonaneurysmal without ascites.  IMPRESSION: "Wall echo shadow" complex, most consistent with a stone filled gallbladder.  The largest stone measures 1.5 cm.  Although the patient is tender in the region of the gallbladder, no specific evidence of acute cholecystitis is identified.  Heterogeneous hepatic steatosis.   Original Report Authenticated By: Jeronimo Greaves, M.D.    Ct Abdomen Pelvis W Contrast  02/09/2012  *RADIOLOGY REPORT*  Clinical Data: Right upper quadrant pain and elevated liver enzymes.  Nausea.  History of testicular cancer.  CT ABDOMEN AND PELVIS WITH CONTRAST  Technique:  Multidetector CT imaging of the abdomen and pelvis was performed following the standard protocol during bolus administration of intravenous contrast.  Contrast: OMNIPAQUE IOHEXOL 300 MG/ML  SOLN  Comparison: Ultrasound 02/09/2012.  Findings: Minimal dependent atelectasis in the lung bases.  Diffuse low attenuation change in the liver consistent with fatty infiltration.  No focal lesions are identified.  There is mild gallbladder wall thickening.  Cholecystitis is not excluded.  The stones seen on ultrasound are not identified by CT.  No bile duct dilatation.  The pancreas, adrenal glands, spleen, abdominal aorta, and retroperitoneal lymph nodes are unremarkable.  Sub centimeter parenchymal cysts in both kidneys.  No solid mass or hydronephrosis is identified.  The stomach, small bowel, and colon are decompressed.  Contrast material does flow through to the colon suggesting no evidence of obstruction.  No free air or  free fluid in the abdomen.  Pelvis:  The appendix is normal.  Scattered diverticula in the sigmoid colon without diverticulitis.  Prostate gland is not enlarged.  The bladder wall appears thickened but this may be due to under distension.  Tenting of the anterior bladder with linear scarring to the umbilicus may suggest urachal remnant.  No significant lymphadenopathy in the pelvis.  No free or loculated pelvic fluid collections.  IMPRESSION: Diffuse fatty infiltration of the liver.  Mild gallbladder wall edema may suggest cholecystitis.  Stones are not visualized. 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  No evidence of metastatic disease.   Original Report Authenticated By: Burman Nieves, M.D.    Mr Mra Head/brain Wo Cm  02/10/2012  *RADIOLOGY REPORT*  Clinical Data:  Sickle cell trait.  Brief episode of left-sided numbness and weakness lasting 10 minutes, now resolved. History of hypertension.  Possible posterior circulation TIA.  MRI HEAD WITHOUT CONTRAST MRA HEAD WITHOUT CONTRAST  Technique:  Multiplanar, multiecho pulse sequences of the brain and surrounding structures were obtained without intravenous contrast. Angiographic images of the head were obtained using MRA technique without contrast.  Comparison:  Normal CT head 02/09/2012.  MRI HEAD  Findings:  There is no evidence for acute infarction, intracranial hemorrhage, mass lesion, hydrocephalus, or extra-axial fluid. There is no atrophy or white matter  disease.  No evidence for large vessel infarction.  No foci of chronic hemorrhage.  Normal orbits. 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  No air-fluid levels.  Negative mastoids. Unremarkable midline structures.  No worrisome osseous lesions.  IMPRESSION: No evidence for acute stroke or significant chronic cerebrovascular disease.  MRA HEAD  Findings: Widely patent internal carotid arteries and basilar artery.  Vertebrals codominant.  No intracranial  stenosis or aneurysm.  IMPRESSION: Negative exam.   Original Report Authenticated By: Davonna Belling, M.D.     Anti-infectives: Anti-infectives     Start     Dose/Rate Route Frequency Ordered Stop   02/09/12 2000   piperacillin-tazobactam (ZOSYN) IVPB 3.375 g        3.375 g 12.5 mL/hr over 240 Minutes Intravenous Every 8 hours 02/09/12 1904            Assessment/Plan: s/p * No surgery found * Pt with decreasing TB and LFTS today. WBC WNL Pt can have his diet adv as tol. No plans for surgery at this time.     LOS: 2 days    Marigene Ehlers., [facility] 02/11/2012

## 2012-02-11 NOTE — Progress Notes (Signed)
VASCULAR LAB PRELIMINARY  PRELIMINARY  PRELIMINARY  PRELIMINARY  Carotid Dopplers completed.    Preliminary report:  There is no ICA stenosis.  Vertebral artery flow is antegrade.  Tanairi Cypert, RVT 02/11/2012, 11:09 AM

## 2012-02-11 NOTE — Progress Notes (Signed)
Occupational Therapy Evaluation Patient Details Name: Blakeley Margraf MRN: [id number] DOB: 1970/11/16 Today's Date: 02/11/2012 Time: 6578-4696 OT Time Calculation (min): 13 min  OT Assessment / Plan / Recommendation Clinical Impression  42 yo with abd pain. Pt with abnormal liver enzymes. Developed L sided weakness/numbness in ER, which has resolved. Pt at current baseline level withADL and mobility. OK to D/C home from  mobility standpoint. No further OT services needed.    OT Assessment  Patient does not need any further OT services    Follow Up Recommendations  No OT follow up    Barriers to Discharge  none    Equipment Recommendations  None recommended by OT    Recommendations for Other Services  none  Frequency    eval only   Precautions / Restrictions Precautions Precautions: None   Pertinent Vitals/Pain No c/o pain    ADL  Eating/Feeding: Independent Where Assessed - Eating/Feeding: Edge of bed Grooming: Independent Where Assessed - Grooming: Unsupported standing Upper Body Bathing: Independent Where Assessed - Upper Body Bathing: Unsupported sitting Lower Body Bathing: Independent Where Assessed - Lower Body Bathing: Unsupported sit to stand Upper Body Dressing: Independent Where Assessed - Upper Body Dressing: Unsupported sitting Lower Body Dressing: Independent Where Assessed - Lower Body Dressing: Unsupported sit to stand Toilet Transfer: Independent Toilet Transfer Method: Sit to Barista: Comfort height toilet Toileting - Clothing Manipulation and Hygiene: Independent Where Assessed - Engineer, mining and Hygiene: Sit to stand from 3-in-1 or toilet Tub/Shower Transfer: Independent Tub/Shower Transfer Method: Science writer: Walk in shower Equipment Used: Gait belt Transfers/Ambulation Related to ADLs: independent ADL Comments: baseline level    OT Diagnosis:    OT Problem List:   OT  Treatment Interventions:     OT Goals Acute Rehab OT Goals OT Goal Formulation:  (eval only)  Visit Information  Last OT Received On: 02/11/12 Assistance Needed: +1    Subjective Data   I feel anxious   Prior Functioning     Home Living Lives With: Alone Available Help at Discharge: Friend(s);Available PRN/intermittently Type of Home: Apartment Home Access: Stairs to enter Entrance Stairs-Number of Steps: 5 Entrance Stairs-Rails: Right;Left Home Layout: One level Firefighter: Standard Home Adaptive Equipment: None Prior Function Level of Independence: Independent Able to Take Stairs?: Yes Driving: Yes Vocation: Full time employment Communication Communication: No difficulties Dominant Hand: Right         Vision/Perception Vision - Assessment Eye Alignment: Within Functional Limits Perception Perception: Within Functional Limits Praxis Praxis: Intact   Cognition  Overall Cognitive Status: Appears within functional limits for tasks assessed/performed Arousal/Alertness: Awake/alert Orientation Level: Oriented X4 / Intact Behavior During Session: WFL for tasks performed Cognition - Other Comments: discussed current stressors in life.  (pt ? if this was anxiety attack with L sided weakness)    Extremity/Trunk Assessment Right Upper Extremity Assessment RUE ROM/Strength/Tone: Within functional levels RUE Sensation: WFL - Light Touch;WFL - Proprioception RUE Coordination: WFL - gross/fine motor Left Upper Extremity Assessment LUE ROM/Strength/Tone: Within functional levels LUE Sensation: WFL - Light Touch;WFL - Proprioception LUE Coordination: WFL - gross/fine motor Right Lower Extremity Assessment RLE ROM/Strength/Tone: Within functional levels Left Lower Extremity Assessment LLE ROM/Strength/Tone: Within functional levels Trunk Assessment Trunk Assessment: Normal     Mobility Bed Mobility Bed Mobility: Supine to Sit;Sit to Supine Supine to Sit:  7: Independent Sitting - Scoot to Edge of Bed: 7: Independent Transfers Transfers: Sit to Stand;Stand to Sit Sit to Stand: 7: Independent;With  upper extremity assist;From bed Stand to Sit: 7: Independent;With upper extremity assist;To chair/3-in-1     Shoulder Instructions     Exercise     Balance  independent   End of Session OT - End of Session Equipment Utilized During Treatment: Gait belt Activity Tolerance: Patient tolerated treatment well Patient left: in bed;with call bell/phone within reach Nurse Communication: Mobility status  GO     Shondale Quinley,HILLARY 02/11/2012, 7:02 PM [facility], OTR/L  [phone] 02/11/2012

## 2012-02-11 NOTE — Progress Notes (Signed)
NEURO HOSPITALIST PROGRESS NOTE   SUBJECTIVE:                                                                                                                        Feeling much better. Asymptomatic from a neurological standpoint. MRI/MRA brain unremarkable.  OBJECTIVE:                                                                                                                           Vital signs in last 24 hours: Temp:  [98.1 F (36.7 C)-98.7 F (37.1 C)] 98.2 F (36.8 C) (01/19 1413) Pulse Rate:  [69-77] 77  (01/19 1413) Resp:  [20] 20  (01/19 1413) BP: (121-136)/(71-81) 136/80 mmHg (01/19 1413) SpO2:  [97 %-99 %] 98 % (01/19 1413)  Intake/Output from previous day: 01/18 0701 - 01/19 0700 In: 480 [P.O.:480] Out: 2325 [Urine:2325] Intake/Output this shift: Total I/O In: 840 [P.O.:840] Out: 725 [Urine:725] Nutritional status: Cardiac  Past Medical History  Diagnosis Date  . Neck mass   . Clotting disorder   . Sickle cell trait   . Pulmonary embolism   . Cancer     Neurologic ROS negative with exception of above. Musculoskeletal ROS: deferred.  Neurologic Exam:  Mental Status: Alert, oriented, thought content appropriate.  Speech fluent without evidence of aphasia.  Able to follow 3 step commands without difficulty. Cranial Nerves: II: Discs flat bilaterally; Visual fields grossly normal, pupils equal, round, reactive to light and accommodation III,IV, VI: ptosis not present, extra-ocular motions intact bilaterally V,VII: smile symmetric, facial light touch sensation normal bilaterally VIII: hearing normal bilaterally IX,X: gag reflex present XI: bilateral shoulder shrug XII: midline tongue extension Motor: Right : Upper extremity   5/5    Left:     Upper extremity   5/5  Lower extremity   5/5     Lower extremity   5/5 Tone and bulk:normal tone throughout; no atrophy noted Sensory: Pinprick and light touch intact  throughout, bilaterally Deep Tendon Reflexes: 2+ and symmetric throughout Plantars: Right: downgoing   Left: downgoing Cerebellar: normal finger-to-nose,  normal heel-to-shin test Gait: no ataxia. CV: pulses palpable throughout     Lab Results: Lab Results  Component Value Date/Time   CHOL 205*  02/10/2012  6:15 AM   Lipid Panel  Basename 02/10/12 0615  CHOL 205*  TRIG 222*  HDL 23*  CHOLHDL 8.9  VLDL 44*  LDLCALC 138*    Studies/Results: Mr Brain Wo Contrast  02/10/2012  *RADIOLOGY REPORT*  Clinical Data:  Sickle cell trait.  Brief episode of left-sided numbness and weakness lasting 10 minutes, now resolved. History of hypertension.  Possible posterior circulation TIA.  MRI HEAD WITHOUT CONTRAST MRA HEAD WITHOUT CONTRAST  Technique:  Multiplanar, multiecho pulse sequences of the brain and surrounding structures were obtained without intravenous contrast. Angiographic images of the head were obtained using MRA technique without contrast.  Comparison:  Normal CT head 02/09/2012.  MRI HEAD  Findings:  There is no evidence for acute infarction, intracranial hemorrhage, mass lesion, hydrocephalus, or extra-axial fluid. There is no atrophy or white matter disease.  No evidence for large vessel infarction.  No foci of chronic hemorrhage.  Normal orbits. 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  No air-fluid levels.  Negative mastoids. Unremarkable midline structures.  No worrisome osseous lesions.  IMPRESSION: No evidence for acute stroke or significant chronic cerebrovascular disease.  MRA HEAD  Findings: Widely patent internal carotid arteries and basilar artery.  Vertebrals codominant.  No intracranial stenosis or aneurysm.  IMPRESSION: Negative exam.   Original Report Authenticated By: Davonna Belling, M.D.    Ct Abdomen Pelvis W Contrast  02/09/2012  *RADIOLOGY REPORT*  Clinical Data: Right upper quadrant pain and elevated liver enzymes.  Nausea.  History  of testicular cancer.  CT ABDOMEN AND PELVIS WITH CONTRAST  Technique:  Multidetector CT imaging of the abdomen and pelvis was performed following the standard protocol during bolus administration of intravenous contrast.  Contrast: OMNIPAQUE IOHEXOL 300 MG/ML  SOLN  Comparison: Ultrasound 02/09/2012.  Findings: Minimal dependent atelectasis in the lung bases.  Diffuse low attenuation change in the liver consistent with fatty infiltration.  No focal lesions are identified.  There is mild gallbladder wall thickening.  Cholecystitis is not excluded.  The stones seen on ultrasound are not identified by CT.  No bile duct dilatation.  The pancreas, adrenal glands, spleen, abdominal aorta, and retroperitoneal lymph nodes are unremarkable.  Sub centimeter parenchymal cysts in both kidneys.  No solid mass or hydronephrosis is identified.  The stomach, small bowel, and colon are decompressed.  Contrast material does flow through to the colon suggesting no evidence of obstruction.  No free air or free fluid in the abdomen.  Pelvis:  The appendix is normal.  Scattered diverticula in the sigmoid colon without diverticulitis.  Prostate gland is not enlarged.  The bladder wall appears thickened but this may be due to under distension.  Tenting of the anterior bladder with linear scarring to the umbilicus may suggest urachal remnant.  No significant lymphadenopathy in the pelvis.  No free or loculated pelvic fluid collections.  IMPRESSION: Diffuse fatty infiltration of the liver.  Mild gallbladder wall edema may suggest cholecystitis.  Stones are not visualized. 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  No evidence of metastatic disease.   Original Report Authenticated By: Burman Nieves, M.D.    Mr Mra Head/brain Wo Cm  02/10/2012  *RADIOLOGY REPORT*  Clinical Data:  Sickle cell trait.  Brief episode of left-sided numbness and weakness lasting 10 minutes, now resolved. History of hypertension.  Possible posterior  circulation TIA.  MRI HEAD WITHOUT CONTRAST MRA HEAD WITHOUT CONTRAST  Technique:  Multiplanar, multiecho pulse sequences of the brain and  surrounding structures were obtained without intravenous contrast. Angiographic images of the head were obtained using MRA technique without contrast.  Comparison:  Normal CT head 02/09/2012.  MRI HEAD  Findings:  There is no evidence for acute infarction, intracranial hemorrhage, mass lesion, hydrocephalus, or extra-axial fluid. There is no atrophy or white matter disease.  No evidence for large vessel infarction.  No foci of chronic hemorrhage.  Normal orbits. 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  No air-fluid levels.  Negative mastoids. Unremarkable midline structures.  No worrisome osseous lesions.  IMPRESSION: No evidence for acute stroke or significant chronic cerebrovascular disease.  MRA HEAD  Findings: Widely patent internal carotid arteries and basilar artery.  Vertebrals codominant.  No intracranial stenosis or aneurysm.  IMPRESSION: Negative exam.   Original Report Authenticated By: Davonna Belling, M.D.     MEDICATIONS                                                                                                                       I have reviewed the patient's current medications.  ASSESSMENT/PLAN:                                                                                                           No active neurological issues at this moment. Normal neuro-exam and unremarkable neuro-imaging. Will sign off. Call neurology with questions or concerns.  Wyatt Portela, MD [facility] [phone]  02/11/2012, 5:51 PM

## 2012-02-11 NOTE — Progress Notes (Signed)
[facility] PROGRESS NOTE  Bernhardt Riemenschneider ZOX:[id number] DOB: 03/17/1970 DOA: 02/09/2012 PCP: No primary provider on file.  Assessment/Plan: 1. Right upper quadrant abdominal pain. Elevated liver enzymes. Liberal use of acetaminophen in the last week. -LFT's continue to trend down -Continue Supportive care; no more acetylcysteine needed since 1/18 as per protocol and poison control rec's -Hepatitis panel negative -no surgery for now; but most likely elective cholecystectomy in the future once liver function back to normal -advance diet and transition abx's to PO -if everything tolerated will d/c home in am.  2. Episode of left-sided weakness transient in the ER which has since resolved. This could be complex migraine versus TIA - head CT stable. -continue ASA -TIA workup essentially negative so far -no neurologic deficit at present  3. History of sickle cell trait and PE in the past. No acute issues will continue heparin for DVT prophylaxis.   4. Nonspecific EKG change found incidentally. Patient is chest pain-free. Cardiac enzymes negative; will follow 2-D echo.  5.Fatty liver and transaminitis: workup as mentioned on #1; instructed to lose weight and follow low fat diet.  6.GERD: continue PPI  7.Depression: continue lexapro  DVt: heparin.  Code Status: Full Family Communication: no family at bedside Disposition Plan: follow results of test ordered, home when medically stable   Consultants:  Surgery  neurology  Procedures:  Se below for x-ray reports  2-D echo and carotid dopplers (pending)  Carotid dopplers WNL  Antibiotics:  Zosyn 1/17--1/19  Cipro 1/20  HPI/Subjective: Afebrile, feeling a lot better; denies abd. Pain, nausea, vomiting or any other acute complaints. Patient is hungry and will like to advance diet.  Objective: Filed Vitals:   02/10/12 1418 02/10/12 2130 02/11/12 0400 02/11/12 1413  BP: 137/88 131/81 121/71 136/80  Pulse: 75 69 71  77  Temp: 98.2 F (36.8 C) 98.7 F (37.1 C) 98.1 F (36.7 C) 98.2 F (36.8 C)  TempSrc: Oral  Oral Oral  Resp: 18 20  20   Height:      Weight:      SpO2: 99% 99% 97% 98%    Intake/Output Summary (Last 24 hours) at 02/11/12 1623 Last data filed at 02/11/12 1413  Gross per 24 hour  Intake   1320 ml  Output   2150 ml  Net   -830 ml   Filed Weights   02/09/12 1300 02/09/12 1921  Weight: 112 kg (246 lb 14.6 oz) 113.4 kg (250 lb)    Exam:   General:  NAD, afebrile, hungry and w/o significant abdominal pain  Cardiovascular: regular rate and rhythm, no rubs or gallops  Respiratory: CTA  Abdomen: NT. ND, positive BS, no guarding  Neuro: non focal deficit today.  Data Reviewed: Basic Metabolic Panel:  Lab 02/11/12 8119 02/10/12 0950 02/10/12 0615 02/09/12 2003 02/09/12 1557 02/09/12 1253  NA 139 138 140 -- 142 141  K 3.5 3.8 3.7 -- 3.5 3.5  CL 104 103 105 -- 106 103  CO2 24 22 23  -- -- 24  GLUCOSE 97 115* 138* -- 128* 81  BUN 8 6 5* -- <3* 5*  CREATININE 0.71 0.57 0.62 0.56 1.00 --  CALCIUM 9.3 9.0 9.4 -- -- 9.9  MG -- -- -- -- -- --  PHOS -- -- -- -- -- --   Liver Function Tests:  Lab 02/11/12 0600 02/10/12 0950 02/10/12 0615 02/09/12 1253  AST 160* 182* 206* 356*  ALT 489* 570* 608* 907*  ALKPHOS 88 91 99 127*  BILITOT 5.6* 10.5*  9.9* 5.4*  PROT 6.8 6.7 7.3 8.0  ALBUMIN 3.5 3.4* 3.6 4.2    Lab 02/09/12 1253  LIPASE 15  AMYLASE --   CBC:  Lab 02/11/12 0600 02/10/12 0615 02/09/12 2003 02/09/12 1557 02/09/12 1253  WBC 6.2 7.2 5.5 -- 4.9  NEUTROABS -- -- -- -- 3.2  HGB 12.7* 14.9 14.8 16.3 15.3  HCT 37.7* 42.4 41.4 48.0 44.9  MCV 79.2 78.1 77.4* -- 78.2  PLT 190 172 158 -- 183   Cardiac Enzymes:  Lab 02/10/12 0954 02/10/12 0052 02/09/12 1952 02/09/12 1545  CKTOTAL -- -- -- --  CKMB -- -- -- --  CKMBINDEX -- -- -- --  TROPONINI <0.30 <0.30 <0.30 <0.30   CBG:  Lab 02/09/12 1648  GLUCAP 127*    Studies: Mr Brain Wo Contrast  02/10/2012   *RADIOLOGY REPORT*  Clinical Data:  Sickle cell trait.  Brief episode of left-sided numbness and weakness lasting 10 minutes, now resolved. History of hypertension.  Possible posterior circulation TIA.  MRI HEAD WITHOUT CONTRAST MRA HEAD WITHOUT CONTRAST  Technique:  Multiplanar, multiecho pulse sequences of the brain and surrounding structures were obtained without intravenous contrast. Angiographic images of the head were obtained using MRA technique without contrast.  Comparison:  Normal CT head 02/09/2012.  MRI HEAD  Findings:  There is no evidence for acute infarction, intracranial hemorrhage, mass lesion, hydrocephalus, or extra-axial fluid. There is no atrophy or white matter disease.  No evidence for large vessel infarction.  No foci of chronic hemorrhage.  Normal orbits. 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  No air-fluid levels.  Negative mastoids. Unremarkable midline structures.  No worrisome osseous lesions.  IMPRESSION: No evidence for acute stroke or significant chronic cerebrovascular disease.  MRA HEAD  Findings: Widely patent internal carotid arteries and basilar artery.  Vertebrals codominant.  No intracranial stenosis or aneurysm.  IMPRESSION: Negative exam.   Original Report Authenticated By: Davonna Belling, M.D.    Ct Abdomen Pelvis W Contrast  02/09/2012  *RADIOLOGY REPORT*  Clinical Data: Right upper quadrant pain and elevated liver enzymes.  Nausea.  History of testicular cancer.  CT ABDOMEN AND PELVIS WITH CONTRAST  Technique:  Multidetector CT imaging of the abdomen and pelvis was performed following the standard protocol during bolus administration of intravenous contrast.  Contrast: OMNIPAQUE IOHEXOL 300 MG/ML  SOLN  Comparison: Ultrasound 02/09/2012.  Findings: Minimal dependent atelectasis in the lung bases.  Diffuse low attenuation change in the liver consistent with fatty infiltration.  No focal lesions are identified.  There is mild  gallbladder wall thickening.  Cholecystitis is not excluded.  The stones seen on ultrasound are not identified by CT.  No bile duct dilatation.  The pancreas, adrenal glands, spleen, abdominal aorta, and retroperitoneal lymph nodes are unremarkable.  Sub centimeter parenchymal cysts in both kidneys.  No solid mass or hydronephrosis is identified.  The stomach, small bowel, and colon are decompressed.  Contrast material does flow through to the colon suggesting no evidence of obstruction.  No free air or free fluid in the abdomen.  Pelvis:  The appendix is normal.  Scattered diverticula in the sigmoid colon without diverticulitis.  Prostate gland is not enlarged.  The bladder wall appears thickened but this may be due to under distension.  Tenting of the anterior bladder with linear scarring to the umbilicus may suggest urachal remnant.  No significant lymphadenopathy in the pelvis.  No free or loculated pelvic fluid collections.  IMPRESSION: Diffuse  fatty infiltration of the liver.  Mild gallbladder wall edema may suggest cholecystitis.  Stones are not visualized. 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  No evidence of metastatic disease.   Original Report Authenticated By: Burman Nieves, M.D.    Mr Mra Head/brain Wo Cm  02/10/2012  *RADIOLOGY REPORT*  Clinical Data:  Sickle cell trait.  Brief episode of left-sided numbness and weakness lasting 10 minutes, now resolved. History of hypertension.  Possible posterior circulation TIA.  MRI HEAD WITHOUT CONTRAST MRA HEAD WITHOUT CONTRAST  Technique:  Multiplanar, multiecho pulse sequences of the brain and surrounding structures were obtained without intravenous contrast. Angiographic images of the head were obtained using MRA technique without contrast.  Comparison:  Normal CT head 02/09/2012.  MRI HEAD  Findings:  There is no evidence for acute infarction, intracranial hemorrhage, mass lesion, hydrocephalus, or extra-axial fluid. There is no atrophy or white  matter disease.  No evidence for large vessel infarction.  No foci of chronic hemorrhage.  Normal orbits. 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  No air-fluid levels.  Negative mastoids. Unremarkable midline structures.  No worrisome osseous lesions.  IMPRESSION: No evidence for acute stroke or significant chronic cerebrovascular disease.  MRA HEAD  Findings: Widely patent internal carotid arteries and basilar artery.  Vertebrals codominant.  No intracranial stenosis or aneurysm.  IMPRESSION: Negative exam.   Original Report Authenticated By: Davonna Belling, M.D.     Scheduled Meds:    . aspirin  81 mg Oral Daily  . clorazepate  7.5 mg Oral BID  . escitalopram  10 mg Oral Daily  . heparin  5,000 Units Subcutaneous Q8H  . pantoprazole  40 mg Oral BID  . piperacillin-tazobactam (ZOSYN)  IV  3.375 g Intravenous Q8H  . sodium chloride  3 mL Intravenous Q12H   Continuous Infusions:   Time spent: < 30 minutes    Heena Woodbury  [facility] Pager [phone]. If 8PM-8AM, please contact night-coverage at [email or web address], password [facility] 02/11/2012, 4:23 PM  LOS: 2 days

## 2012-02-12 DIAGNOSIS — I10 Essential (primary) hypertension: Secondary | ICD-10-CM

## 2012-02-12 LAB — CBC
Hemoglobin: 12.7 g/dL — ABNORMAL LOW (ref 13.0–17.0)
MCHC: 33.7 g/dL (ref 30.0–36.0)
RDW: 12.9 % (ref 11.5–15.5)
WBC: 5.4 10*3/uL (ref 4.0–10.5)

## 2012-02-12 LAB — COMPREHENSIVE METABOLIC PANEL
ALT: 392 U/L — ABNORMAL HIGH (ref 0–53)
Albumin: 3.5 g/dL (ref 3.5–5.2)
Alkaline Phosphatase: 80 U/L (ref 39–117)
Chloride: 102 mEq/L (ref 96–112)
Potassium: 3.3 mEq/L — ABNORMAL LOW (ref 3.5–5.1)
Sodium: 140 mEq/L (ref 135–145)
Total Bilirubin: 3.1 mg/dL — ABNORMAL HIGH (ref 0.3–1.2)
Total Protein: 6.9 g/dL (ref 6.0–8.3)

## 2012-02-12 MED ORDER — ASPIRIN 81 MG PO CHEW: 81.0000 mg | CHEWABLE_TABLET | Freq: Every day | ORAL | Status: DC

## 2012-02-12 MED ORDER — TRAMADOL HCL 50 MG PO TABS: 50.0000 mg | ORAL_TABLET | Freq: Four times a day (QID) | ORAL | Status: DC | PRN

## 2012-02-12 MED ORDER — IBUPROFEN 200 MG PO TABS: 400.0000 mg | ORAL_TABLET | Freq: Three times a day (TID) | ORAL | Status: DC | PRN

## 2012-02-12 MED ORDER — CIPROFLOXACIN HCL 500 MG PO TABS: 500.0000 mg | ORAL_TABLET | Freq: Two times a day (BID) | ORAL | Status: DC

## 2012-02-12 MED ORDER — PANTOPRAZOLE SODIUM 40 MG PO TBEC: 40.0000 mg | DELAYED_RELEASE_TABLET | Freq: Two times a day (BID) | ORAL | Status: DC

## 2012-02-12 NOTE — Discharge Summary (Signed)
Physician Discharge Summary  Mike Webb NWG:[id number] DOB: 1970-04-21 DOA: 02/09/2012  PCP: No primary provider on file.  Admit date: 02/09/2012 Discharge date: 02/12/2012  Time spent: >30 minutes  Recommendations for Outpatient Follow-up:  -CMET to follow electrolytes, kidney function and LFT's -repeat lipid panel in 6-8 weeks; decide starting or not statins base on his LFT's.   Discharge Diagnoses:  Principal Problem:  *Cholecystitis Active Problems:  Clotting disorder  Sickle cell trait  Pulmonary embolism  Cancer  Elevated liver enzymes  Left-sided weakness   Discharge Condition: stable and improved. No abdominal pain, no nausea, no vomiting. Tolerating regular diet. Will follow with PCP in 2 weeks. Advise to stop smoking and to take medications as prescribed.  Diet recommendation: low sodium and low fat diet  Filed Weights   02/09/12 1300 02/09/12 1921  Weight: 112 kg (246 lb 14.6 oz) 113.4 kg (250 lb)    History of present illness:  42 y.o. male, with history of sickle cell trait, PE more than 10 years ago, who was in good state of health until about one week ago when he started experiencing right upper quadrant abdominal pain associated with nausea, pain is sharp intermittent nonradiating worse with eating food better with bowel rest and pain medications, he presented himself to urgent care center where he was given Tylenol-based pain medication which she had been taking Librium, presented to the ER as he was not feeling better, in the ER CMP suggested elevated liver enzymes, acetaminophen level was stable, ultrasound suggested possible cholecystitis with gallbladder full of stones, patient also while sitting in the ER had a sudden onset of left-sided numbness and weakness lasting 10 minutes, cold stroke was called, head CT was stable, patient symptom has since resolved. He has been seen by neurologist and surgery was consulted    Hospital Course:  1. Right upper  quadrant abdominal pain. Elevated liver enzymes. Liberal use of acetaminophen in the last week.  -LFT's continue to trend down  -Patient tolerating diet now and w/o any abdominal pain. Advise to avoid any tylenol use, to keep himself hydrated and to follow with PCP in 2 weeks. -Hepatitis panel negative  -no surgery for now; but most likely elective cholecystectomy in the future once liver function back to normal. They will see him in 4-6 weeks. -Will finish antibiotics by mouth to complete 10 days for potential cholecystitis  2. Episode of left-sided weakness transient in the ER which has since resolved. This could be complex migraine versus TIA -will discharge on daily ASA as patient has risk factors -TIA workup negative so far  -no neurologic deficit at discharge -advise to quit smoking, and to be compliant with medications for BP.   3. History of sickle cell trait and PE in the past. No acute issues appreciated during admission. DVT prophylaxis provided with the use of heparin.   4. Nonspecific EKG change found incidentally. Patient remains chest pain-free. Cardiac enzymes negative; no abnormalities on telemetry. 2-D echo done but pending at discharge.   5.Fatty liver and transaminitis: workup as mentioned on #1; instructed to lose weight and follow low fat diet.   6.GERD: continue PPI  7.HLD: at this point unable to start statins due to elevated LFT's. Close follow up as an outpatient.   Procedures:  2-D echo: Done but final results and reading pending; will be follow by PCP.  Carotid dopplers:There is no ICA stenosis. Vertebral artery flow is antegrade.  See below for x-ray reports.  Consultations:  Neurology  Surgery  Discharge Exam: Filed Vitals:   02/11/12 2100 02/12/12 0600 02/12/12 0800 02/12/12 1200  BP: 143/77 124/82 138/89 144/92  Pulse: 84 74 78 73  Temp: 98.8 F (37.1 C) 97.6 F (36.4 C) 98 F (36.7 C) 98 F (36.7 C)  TempSrc:      Resp: 18 18 17 18     Height:      Weight:      SpO2: 94% 98% 100% 99%    General: NAD, afebrile, feeling great. Cardiovascular: S1 and S2, no rubs or gallops Respiratory: CTA Abdomen: no pain, no distension, positive BS Neuro: non focal deficit.  Discharge Instructions  Discharge Orders    Future Orders Please Complete By Expires   Diet - low sodium heart healthy      Discharge instructions      Comments:   -Take medications as prescribed -Follow a low fat diet -Keep yourself well hydrated. -Arrange follow up with PCP in 2 weeks -Follow up with general surgery in 4-6 weeks       Medication List     As of 02/12/2012  1:19 PM    STOP taking these medications         acetaminophen 500 MG tablet   Commonly known as: TYLENOL      TAKE these medications         AMBIEN 10 MG tablet   Generic drug: zolpidem   Take 10 mg by mouth at bedtime as needed. For sleep      ANDROGEL PUMP 20.25 MG/ACT (1.62%) Gel   Generic drug: Testosterone   Place 1 application onto the skin 2 (two) times daily.      aspirin 81 MG chewable tablet   Chew 1 tablet (81 mg total) by mouth daily.      CHANTIX PO   Take 1 mg by mouth 2 (two) times daily.      ciprofloxacin 500 MG tablet   Commonly known as: CIPRO   Take 1 tablet (500 mg total) by mouth 2 (two) times daily.      clorazepate 7.5 MG tablet   Commonly known as: TRANXENE   Take 7.5 mg by mouth 2 (two) times daily.      ergocalciferol 50000 UNITS capsule   Commonly known as: VITAMIN D2   Take 50,000 Units by mouth once a week.      escitalopram 10 MG tablet   Commonly known as: LEXAPRO   Take 10 mg by mouth daily.      FLEXERIL 10 MG tablet   Generic drug: cyclobenzaprine   Take 10 mg by mouth 3 (three) times daily as needed. For muscle spasms      ibuprofen 200 MG tablet   Commonly known as: ADVIL,MOTRIN   Take 2 tablets (400 mg total) by mouth every 8 (eight) hours as needed. For pain      lisinopril 20 MG tablet   Commonly known as:  PRINIVIL,ZESTRIL   Take 20 mg by mouth daily.      multivitamin with minerals Tabs   Take 1 tablet by mouth daily.      pantoprazole 40 MG tablet   Commonly known as: PROTONIX   Take 1 tablet (40 mg total) by mouth 2 (two) times daily.      traMADol 50 MG tablet   Commonly known as: ULTRAM   Take 1 tablet (50 mg total) by mouth every 6 (six) hours as needed for pain (severe pain, no relieved by ibuprofen).  The results of significant diagnostics from this hospitalization (including imaging, microbiology, ancillary and laboratory) are listed below for reference.    Significant Diagnostic Studies: Dg Chest 2 View  02/09/2012  *RADIOLOGY REPORT*  Clinical Data: Chest pain, weakness  CHEST - 2 VIEW  Comparison: None.  Findings: Cardiomediastinal silhouette is unremarkable.  No acute infiltrate or pleural effusion.  No pulmonary edema.  Bony thorax is unremarkable.  IMPRESSION: No active disease.   Original Report Authenticated By: Natasha Mead, M.D.    Ct Head Wo Contrast  02/09/2012  *RADIOLOGY REPORT*  Clinical Data: Left-sided weakness. Code stroke.  CT HEAD WITHOUT CONTRAST  Technique:  Contiguous axial images were obtained from the base of the skull through the vertex without contrast.  Comparison: None.  Findings: Normal appearance of the intracranial structures.  There is no evidence for acute hemorrhage, mass lesion, midline shift, hydrocephalus or large infarct.  There is deformity of the right medial orbital that probably represents old trauma.  Frontal sinuses are absent.  IMPRESSION: No acute intracranial abnormality.  These results were called by telephone on 02/09/2012 at 3:58 p.m. to Dr. Leroy Kennedy, who verbally acknowledged these results.   Original Report Authenticated By: Richarda Overlie, M.D.    Mr Brain Wo Contrast  02/10/2012  *RADIOLOGY REPORT*  Clinical Data:  Sickle cell trait.  Brief episode of left-sided numbness and weakness lasting 10 minutes, now resolved. History of  hypertension.  Possible posterior circulation TIA.  MRI HEAD WITHOUT CONTRAST MRA HEAD WITHOUT CONTRAST  Technique:  Multiplanar, multiecho pulse sequences of the brain and surrounding structures were obtained without intravenous contrast. Angiographic images of the head were obtained using MRA technique without contrast.  Comparison:  Normal CT head 02/09/2012.  MRI HEAD  Findings:  There is no evidence for acute infarction, intracranial hemorrhage, mass lesion, hydrocephalus, or extra-axial fluid. There is no atrophy or white matter disease.  No evidence for large vessel infarction.  No foci of chronic hemorrhage.  Normal orbits. 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  No air-fluid levels.  Negative mastoids. Unremarkable midline structures.  No worrisome osseous lesions.  IMPRESSION: No evidence for acute stroke or significant chronic cerebrovascular disease.  MRA HEAD  Findings: Widely patent internal carotid arteries and basilar artery.  Vertebrals codominant.  No intracranial stenosis or aneurysm.  IMPRESSION: Negative exam.   Original Report Authenticated By: Davonna Belling, M.D.    US Abdomen Complete  02/09/2012  *RADIOLOGY REPORT*  Clinical Data:  Elevated liver function tests.  COMPLETE ABDOMINAL ULTRASOUND  Comparison:  06/21/2007  Findings:  Gallbladder:  A "wall echo shadow complex".  No pericholecystic fluid.  Wall thickness upper normal, 3 mm.  The patient is tender over the gallbladder, but sonographic Murphy's sign was not elicited.  Common bile duct: Normal, 5 mm.  Liver: Mildly increased in echogenicity with mildly heterogeneous echotexture.  IVC: Negative  Pancreas:  Partially obscured pancreatic tail secondary bowel gas.  Spleen:  Normal in size and echogenicity.  Right Kidney:  13.4 cm. No hydronephrosis.  Left Kidney:  13.1 cm. No hydronephrosis.  Abdominal aorta:  Nonaneurysmal without ascites.  IMPRESSION: "Wall echo shadow" complex, most consistent  with a stone filled gallbladder.  The largest stone measures 1.5 cm.  Although the patient is tender in the region of the gallbladder, no specific evidence of acute cholecystitis is identified.  Heterogeneous hepatic steatosis.   Original Report Authenticated By: Jeronimo Greaves, M.D.    Ct Abdomen Pelvis W Contrast  02/09/2012  *RADIOLOGY REPORT*  Clinical Data: Right upper quadrant pain and elevated liver enzymes.  Nausea.  History of testicular cancer.  CT ABDOMEN AND PELVIS WITH CONTRAST  Technique:  Multidetector CT imaging of the abdomen and pelvis was performed following the standard protocol during bolus administration of intravenous contrast.  Contrast: OMNIPAQUE IOHEXOL 300 MG/ML  SOLN  Comparison: Ultrasound 02/09/2012.  Findings: Minimal dependent atelectasis in the lung bases.  Diffuse low attenuation change in the liver consistent with fatty infiltration.  No focal lesions are identified.  There is mild gallbladder wall thickening.  Cholecystitis is not excluded.  The stones seen on ultrasound are not identified by CT.  No bile duct dilatation.  The pancreas, adrenal glands, spleen, abdominal aorta, and retroperitoneal lymph nodes are unremarkable.  Sub centimeter parenchymal cysts in both kidneys.  No solid mass or hydronephrosis is identified.  The stomach, small bowel, and colon are decompressed.  Contrast material does flow through to the colon suggesting no evidence of obstruction.  No free air or free fluid in the abdomen.  Pelvis:  The appendix is normal.  Scattered diverticula in the sigmoid colon without diverticulitis.  Prostate gland is not enlarged.  The bladder wall appears thickened but this may be due to under distension.  Tenting of the anterior bladder with linear scarring to the umbilicus may suggest urachal remnant.  No significant lymphadenopathy in the pelvis.  No free or loculated pelvic fluid collections.  IMPRESSION: Diffuse fatty infiltration of the liver.  Mild  gallbladder wall edema may suggest cholecystitis.  Stones are not visualized. 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  No evidence of metastatic disease.   Original Report Authenticated By: Burman Nieves, M.D.    Mr Mra Head/brain Wo Cm  02/10/2012  *RADIOLOGY REPORT*  Clinical Data:  Sickle cell trait.  Brief episode of left-sided numbness and weakness lasting 10 minutes, now resolved. History of hypertension.  Possible posterior circulation TIA.  MRI HEAD WITHOUT CONTRAST MRA HEAD WITHOUT CONTRAST  Technique:  Multiplanar, multiecho pulse sequences of the brain and surrounding structures were obtained without intravenous contrast. Angiographic images of the head were obtained using MRA technique without contrast.  Comparison:  Normal CT head 02/09/2012.  MRI HEAD  Findings:  There is no evidence for acute infarction, intracranial hemorrhage, mass lesion, hydrocephalus, or extra-axial fluid. There is no atrophy or white matter disease.  No evidence for large vessel infarction.  No foci of chronic hemorrhage.  Normal orbits. 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  No air-fluid levels.  Negative mastoids. Unremarkable midline structures.  No worrisome osseous lesions.  IMPRESSION: No evidence for acute stroke or significant chronic cerebrovascular disease.  MRA HEAD  Findings: Widely patent internal carotid arteries and basilar artery.  Vertebrals codominant.  No intracranial stenosis or aneurysm.  IMPRESSION: Negative exam.   Original Report Authenticated By: Davonna Belling, M.D.     Microbiology: Recent Results (from the past 240 hour(s))  CULTURE, BLOOD (ROUTINE X 2)     Status: Normal (Preliminary result)   Collection Time   02/09/12  7:45 PM      Component Value Range Status Comment   Specimen Description BLOOD LEFT HAND   Final    Special Requests BOTTLES DRAWN AEROBIC AND ANAEROBIC 10CC   Final    Culture  Setup Time 02/10/2012 01:27   Final    Culture      Final    Value:  BLOOD CULTURE RECEIVED NO GROWTH TO DATE CULTURE WILL BE HELD FOR 5 DAYS BEFORE ISSUING A FINAL NEGATIVE REPORT   Report Status PENDING   Incomplete   CULTURE, BLOOD (ROUTINE X 2)     Status: Normal (Preliminary result)   Collection Time   02/09/12  7:55 PM      Component Value Range Status Comment   Specimen Description BLOOD LEFT HAND   Final    Special Requests BOTTLES DRAWN AEROBIC AND ANAEROBIC 10CC   Final    Culture  Setup Time 02/10/2012 01:27   Final    Culture     Final    Value:        BLOOD CULTURE RECEIVED NO GROWTH TO DATE CULTURE WILL BE HELD FOR 5 DAYS BEFORE ISSUING A FINAL NEGATIVE REPORT   Report Status PENDING   Incomplete      Labs: Basic Metabolic Panel:  Lab 02/12/12 4696 02/11/12 0600 02/10/12 0950 02/10/12 0615 02/09/12 2003 02/09/12 1557 02/09/12 1253  NA 140 139 138 140 -- 142 --  K 3.3* 3.5 3.8 3.7 -- 3.5 --  CL 102 104 103 105 -- 106 --  CO2 26 24 22 23  -- -- 24  GLUCOSE 107* 97 115* 138* -- 128* --  BUN 10 8 6  5* -- <3* --  CREATININE 0.63 0.71 0.57 0.62 0.56 -- --  CALCIUM 9.4 9.3 9.0 9.4 -- -- 9.9  MG -- -- -- -- -- -- --  PHOS -- -- -- -- -- -- --   Liver Function Tests:  Lab 02/12/12 0552 02/11/12 0600 02/10/12 0950 02/10/12 0615 02/09/12 1253  AST 114* 160* 182* 206* 356*  ALT 392* 489* 570* 608* 907*  ALKPHOS 80 88 91 99 127*  BILITOT 3.1* 5.6* 10.5* 9.9* 5.4*  PROT 6.9 6.8 6.7 7.3 8.0  ALBUMIN 3.5 3.5 3.4* 3.6 4.2    Lab 02/09/12 1253  LIPASE 15  AMYLASE --   CBC:  Lab 02/12/12 0552 02/11/12 0600 02/10/12 0615 02/09/12 2003 02/09/12 1557 02/09/12 1253  WBC 5.4 6.2 7.2 5.5 -- 4.9  NEUTROABS -- -- -- -- -- 3.2  HGB 12.7* 12.7* 14.9 14.8 16.3 --  HCT 37.7* 37.7* 42.4 41.4 48.0 --  MCV 79.5 79.2 78.1 77.4* -- 78.2  PLT 183 190 172 158 -- 183   Cardiac Enzymes:  Lab 02/10/12 0954 02/10/12 0052 02/09/12 1952 02/09/12 1545  CKTOTAL -- -- -- --  CKMB -- -- -- --  CKMBINDEX -- -- -- --  TROPONINI <0.30  <0.30 <0.30 <0.30    CBG:  Lab 02/09/12 1648  GLUCAP 127*      Signed:  Hashir Deleeuw  [facility] 02/12/2012, 1:19 PM

## 2012-02-12 NOTE — Progress Notes (Signed)
Patient ID: Mike Webb, male   DOB: [birth year], 42 y.o.   MRN: [id number]    Subjective: Pt feels ok today.  Pain is improving.  Mostly just gas pains now.  Objective: Vital signs in last 24 hours: Temp:  [97.6 F (36.4 C)-98.8 F (37.1 C)] 97.6 F (36.4 C) (01/20 0600) Pulse Rate:  [74-84] 74  (01/20 0600) Resp:  [18-20] 18  (01/20 0600) BP: (124-143)/(77-82) 124/82 mmHg (01/20 0600) SpO2:  [94 %-98 %] 98 % (01/20 0600) Last BM Date: 02/11/12  Intake/Output from previous day: 01/19 0701 - 01/20 0700 In: 1080 [P.O.:1080] Out: 1026 [Urine:1025; Stool:1] Intake/Output this shift:    PE: Abd: soft, minimally tender in RUQ, +BS, ND  Lab Results:   Basename 02/12/12 0552 02/11/12 0600  WBC 5.4 6.2  HGB 12.7* 12.7*  HCT 37.7* 37.7*  PLT 183 190   BMET  Basename 02/12/12 0552 02/11/12 0600  NA 140 139  K 3.3* 3.5  CL 102 104  CO2 26 24  GLUCOSE 107* 97  BUN 10 8  CREATININE 0.63 0.71  CALCIUM 9.4 9.3   PT/INR  Basename 02/10/12 0950 02/09/12 1253  LABPROT 13.8 12.4  INR 1.07 0.93   CMP     Component Value Date/Time   NA 140 02/12/2012 0552   K 3.3* 02/12/2012 0552   CL 102 02/12/2012 0552   CO2 26 02/12/2012 0552   GLUCOSE 107* 02/12/2012 0552   BUN 10 02/12/2012 0552   CREATININE 0.63 02/12/2012 0552   CALCIUM 9.4 02/12/2012 0552   PROT 6.9 02/12/2012 0552   ALBUMIN 3.5 02/12/2012 0552   AST 114* 02/12/2012 0552   ALT 392* 02/12/2012 0552   ALKPHOS 80 02/12/2012 0552   BILITOT 3.1* 02/12/2012 0552   GFRNONAA >90 02/12/2012 0552   GFRAA >90 02/12/2012 0552   Lipase     Component Value Date/Time   LIPASE 15 02/09/2012 1253       Studies/Results: Mr Brain Wo Contrast  02/10/2012  *RADIOLOGY REPORT*  Clinical Data:  Sickle cell trait.  Brief episode of left-sided numbness and weakness lasting 10 minutes, now resolved. History of hypertension.  Possible posterior circulation TIA.  MRI HEAD WITHOUT CONTRAST MRA HEAD WITHOUT CONTRAST  Technique:  Multiplanar,  multiecho pulse sequences of the brain and surrounding structures were obtained without intravenous contrast. Angiographic images of the head were obtained using MRA technique without contrast.  Comparison:  Normal CT head 02/09/2012.  MRI HEAD  Findings:  There is no evidence for acute infarction, intracranial hemorrhage, mass lesion, hydrocephalus, or extra-axial fluid. There is no atrophy or white matter disease.  No evidence for large vessel infarction.  No foci of chronic hemorrhage.  Normal orbits. 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  No air-fluid levels.  Negative mastoids. Unremarkable midline structures.  No worrisome osseous lesions.  IMPRESSION: No evidence for acute stroke or significant chronic cerebrovascular disease.  MRA HEAD  Findings: Widely patent internal carotid arteries and basilar artery.  Vertebrals codominant.  No intracranial stenosis or aneurysm.  IMPRESSION: Negative exam.   Original Report Authenticated By: Davonna Belling, M.D.    Mr Mra Head/brain Wo Cm  02/10/2012  *RADIOLOGY REPORT*  Clinical Data:  Sickle cell trait.  Brief episode of left-sided numbness and weakness lasting 10 minutes, now resolved. History of hypertension.  Possible posterior circulation TIA.  MRI HEAD WITHOUT CONTRAST MRA HEAD WITHOUT CONTRAST  Technique:  Multiplanar, multiecho pulse sequences of the brain and surrounding structures  were obtained without intravenous contrast. Angiographic images of the head were obtained using MRA technique without contrast.  Comparison:  Normal CT head 02/09/2012.  MRI HEAD  Findings:  There is no evidence for acute infarction, intracranial hemorrhage, mass lesion, hydrocephalus, or extra-axial fluid. There is no atrophy or white matter disease.  No evidence for large vessel infarction.  No foci of chronic hemorrhage.  Normal orbits. 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  No air-fluid levels.   Negative mastoids. Unremarkable midline structures.  No worrisome osseous lesions.  IMPRESSION: No evidence for acute stroke or significant chronic cerebrovascular disease.  MRA HEAD  Findings: Widely patent internal carotid arteries and basilar artery.  Vertebrals codominant.  No intracranial stenosis or aneurysm.  IMPRESSION: Negative exam.   Original Report Authenticated By: Davonna Belling, M.D.     Anti-infectives: Anti-infectives     Start     Dose/Rate Route Frequency Ordered Stop   02/12/12 0800   ciprofloxacin (CIPRO) tablet 500 mg        500 mg Oral 2 times daily 02/11/12 1626     02/09/12 2000  piperacillin-tazobactam (ZOSYN) IVPB 3.375 g       3.375 g 12.5 mL/hr over 240 Minutes Intravenous Every 8 hours 02/09/12 1904 02/12/12 0143           Assessment/Plan  1. Tylenol toxicity with hepatitis 2. Cholelithiasis  Plan: 1. Patient's liver needs to recover.  I think the gallstones were incidentally found; however, if he would like to eventually have his gb removed we would be happy to see him as an outpatient for further evaluation.  No surgical indications.  We will sign off.   LOS: 3 days    Mattea Seger E 02/12/2012, 8:17 AM Pager: [phone]

## 2012-02-12 NOTE — Progress Notes (Signed)
Physical Therapy Treatment Patient Details Name: Mike Webb MRN: [id number] DOB: 10/05/1970 Today's Date: 02/12/2012 Time: 7829-5621 PT Time Calculation (min): 10 min  PT Assessment / Plan / Recommendation Comments on Treatment Session  Pt has progressed very well toward his goal with incr mobiltiy, less assistance and no dizziness today. Reports minimal dizziness at times with sudden movements. Only pain is in abdomen with eating which is being addressed by MD's. Pt scheduled for discharge today and is safe at current level to discharge home.    Follow Up Recommendations  No PT follow up;Supervision - Intermittent           Equipment Recommendations  None recommended by PT       Frequency Min 4X/week   Plan Discharge plan remains appropriate;Frequency remains appropriate    Precautions / Restrictions Precautions Precautions: None    Pertinent Vitals/Pain Reports no pain currently    Mobility  Bed Mobility Supine to Sit: 7: Independent Sitting - Scoot to Edge of Bed: 7: Independent Sit to Supine: 7: Independent Transfers Sit to Stand: 7: Independent;With upper extremity assist;From bed Stand to Sit: 7: Independent;With upper extremity assist;To bed Ambulation/Gait Ambulation/Gait Assistance: 7: Independent Ambulation Distance (Feet): 250 Feet Assistive device: None Ambulation/Gait Assistance Details: No dizziness or rest breaks needed today. Gait Pattern: Within Functional Limits Gait velocity: WNL Stairs: Yes Stairs Assistance: 6: Modified independent (Device/Increase time) Stair Management Technique: Two rails;Alternating pattern;Forwards Number of Stairs: 5        PT Goals Acute Rehab PT Goals PT Goal: Sit to Stand - Progress: Met PT Goal: Stand to Sit - Progress: Met PT Goal: Ambulate - Progress: Progressing toward goal PT Goal: Up/Down Stairs - Progress: Met Additional Goals PT Goal: Additional Goal #1 - Progress: Met  Visit Information  Last PT  Received On: 02/12/12 Assistance Needed: +1    Subjective Data  Subjective: Reports feeling much better today, only having trouble with abdomen pain now. No dizziness with session today.   Cognition  Overall Cognitive Status: Appears within functional limits for tasks assessed/performed Arousal/Alertness: Awake/alert Orientation Level: Appears intact for tasks assessed Behavior During Session: [facility] for tasks performed       End of Session PT - End of Session Equipment Utilized During Treatment: Gait belt Activity Tolerance: Patient tolerated treatment well Patient left: in bed;with call bell/phone within reach;Other (comment) (seated on edge of bed)   GP     Sallyanne Kuster 02/12/2012, 10:59 AM  Sallyanne Kuster, PTA Office- [phone]

## 2012-02-12 NOTE — Progress Notes (Signed)
  Echocardiogram 2D Echocardiogram has been performed.  CEFERINO, LANG 02/12/2012, 9:25 AM

## 2012-02-12 NOTE — Progress Notes (Signed)
PT Cancellation Note  Patient Details Name: Zayin Valadez MRN: [id number] DOB: 1970/10/22   Cancelled Treatment:   Pt currently off floor in vascular lab. Will follow up as able today vs tomorrow.  Sallyanne Kuster 02/12/2012, 9:11 AM  Sallyanne Kuster, PTA Office- [phone]

## 2012-02-12 NOTE — Progress Notes (Signed)
DC orders received.  Patient stable with no S/S of distress.  Medication and discharge information reviewed with patient.  Patient DC home. Mike Webb  

## 2012-02-12 NOTE — Progress Notes (Signed)
Agree with PA's note.

## 2012-02-13 NOTE — Progress Notes (Signed)
Utilization review completed.  

## 2012-02-14 ENCOUNTER — Encounter (INDEPENDENT_AMBULATORY_CARE_PROVIDER_SITE_OTHER): Payer: Self-pay | Admitting: General Surgery

## 2012-02-14 ENCOUNTER — Ambulatory Visit (INDEPENDENT_AMBULATORY_CARE_PROVIDER_SITE_OTHER): Payer: BC Managed Care – PPO | Admitting: General Surgery

## 2012-02-14 VITALS — BP 128/74 | HR 83 | Temp 97.8°F | Resp 18 | Ht 67.5 in | Wt 248.6 lb

## 2012-02-14 DIAGNOSIS — D689 Coagulation defect, unspecified: Secondary | ICD-10-CM

## 2012-02-14 DIAGNOSIS — K8020 Calculus of gallbladder without cholecystitis without obstruction: Secondary | ICD-10-CM

## 2012-02-14 DIAGNOSIS — I2699 Other pulmonary embolism without acute cor pulmonale: Secondary | ICD-10-CM

## 2012-02-14 NOTE — Progress Notes (Signed)
Patient ID: Mike Webb, male   DOB: 01-Jun-1970, 42 y.o.   MRN: [id number]  Chief Complaint  Patient presents with  . Follow-up    G. B /left neck lipoma    HPI Mike Webb is a 42 y.o. male.  This patient is referred by Dr. Dareen Piano for evaluation of cholelithiasis and elevated liver function tests.  He was admitted last week to the hospital for abdominal pain and elevated liver function tests. He was found to have cholelithiasis at that time but there was concern that his elevated LFTs might be due to Tylenol toxicity which he had been taking for neck pain. He says that he had been taking about 4500 mg per day. He also says that he does have right upper quarter of abdominal pain which occurs most anytime that he. He says that he has had this for some time and has about 2-3 episodes each year which lasted a few weeks and at times where he had these intermittent episodes of right upper quadrant pain which radiates to his back after eating. This most recent episode has lasted about 3 weeks. He has some associated nausea but has not been throwing up. He does not have any fevers or chills and denies any jaundice. He drinks about 5 drinks per week but has only had a half a beer since his hospitalization. HPI  Past Medical History  Diagnosis Date  . Neck mass   . Clotting disorder   . Sickle cell trait   . Pulmonary embolism   . Cancer     Past Surgical History  Procedure Date  . Surgery scrotal / testicular     No family history on file.  Social History History  Substance Use Topics  . Smoking status: Current Every Day Smoker -- 0.5 packs/day  . Smokeless tobacco: Not on file  . Alcohol Use: 6.0 oz/week    12 drink(s) per week    No Known Allergies  Current Outpatient Prescriptions  Medication Sig Dispense Refill  . aspirin 81 MG chewable tablet Chew 1 tablet (81 mg total) by mouth daily.      . ciprofloxacin (CIPRO) 500 MG tablet Take 1 tablet (500 mg total) by mouth 2 (two)  times daily.  20 tablet  0  . clorazepate (TRANXENE) 7.5 MG tablet Take 7.5 mg by mouth 2 (two) times daily.      . cyclobenzaprine (FLEXERIL) 10 MG tablet Take 10 mg by mouth 3 (three) times daily as needed. For muscle spasms      . ergocalciferol (VITAMIN D2) 50000 UNITS capsule Take 50,000 Units by mouth once a week.      . escitalopram (LEXAPRO) 10 MG tablet Take 10 mg by mouth daily.      Marland Kitchen ibuprofen (ADVIL,MOTRIN) 200 MG tablet Take 2 tablets (400 mg total) by mouth every 8 (eight) hours as needed. For pain  45 tablet  0  . lisinopril (PRINIVIL,ZESTRIL) 20 MG tablet Take 20 mg by mouth daily.      . Multiple Vitamin (MULTIVITAMIN WITH MINERALS) TABS Take 1 tablet by mouth daily.      . pantoprazole (PROTONIX) 40 MG tablet Take 1 tablet (40 mg total) by mouth 2 (two) times daily.  60 tablet  1  . Testosterone (ANDROGEL PUMP) 20.25 MG/ACT (1.62%) GEL Place 1 application onto the skin 2 (two) times daily.      . traMADol (ULTRAM) 50 MG tablet Take 1 tablet (50 mg total) by mouth every 6 (six) hours  as needed for pain (severe pain, no relieved by ibuprofen).  45 tablet  0  . Varenicline Tartrate (CHANTIX PO) Take 1 mg by mouth 2 (two) times daily.       Marland Kitchen zolpidem (AMBIEN) 10 MG tablet Take 10 mg by mouth at bedtime as needed. For sleep      . promethazine (PHENERGAN) 25 MG tablet         Review of Systems Review of Systems All other review of systems negative or noncontributory except as stated in the HPI  Blood pressure 128/74, pulse 83, temperature 97.8 F (36.6 C), resp. rate 18, height 5' 7.5" (1.715 m), weight 248 lb 9.6 oz (112.764 kg).  Physical Exam Physical Exam Physical Exam  Vitals reviewed. Constitutional: He is oriented to person, place, and time. He appears well-developed and well-nourished. No distress.  HENT:  Head: Normocephalic and atraumatic.  Mouth/Throat: No oropharyngeal exudate.  Eyes: Conjunctivae and EOM are normal. Pupils are equal, round, and reactive to  light. Right eye exhibits no discharge. Left eye exhibits no discharge. No scleral icterus.  Neck: Normal range of motion. No tracheal deviation present. He does have a large 8-10cm palpable soft tissue mass in the left neck/trapezius region consistent with known lipoma Cardiovascular: Normal rate, regular rhythm and normal heart sounds.   Pulmonary/Chest: Effort normal and breath sounds normal. No stridor. No respiratory distress. He has no wheezes. He has no rales. He exhibits no tenderness.  Abdominal: Soft. Bowel sounds are normal. He exhibits no distension and no mass. There is mild  Tenderness in the RUQ. There is no rebound and no guarding.  Musculoskeletal: Normal range of motion. He exhibits no edema and no tenderness.  Neurological: He is alert and oriented to person, place, and time.  Skin: Skin is warm and dry. No rash noted. He is not diaphoretic. No erythema. No pallor.  Psychiatric: He has a normal mood and affect. His behavior is normal. Judgment and thought content normal.     Data Reviewed Labs, hospital records, [place], [place]  Assessment    Cholelithiasis and elevated liver function tests I think that this is likely related to his cholelithiasis and possible cholecystitis or choledocholithiasis. He does have postprandial right upper quadrant pain which is consistent with cholelithiasis. However, the Recent hospitalization with questionable Tylenol toxicity confounds the issue.  His elevated liver function tests could be related to this as well. His LFT seem to be improving but he does continue to have right upper quadrant abdominal pain and I think it is reasonable to proceed with cholecystectomy. We did discuss the procedure and its risks. The risks of infection, bleeding, pain, persistent symptoms, scarring, injury to bowel or bile ducts, retained stone, diarrhea, need for additional procedures, and need for open surgery discussed with the patient.  I explained that I would like to  review his case with gastroenterology prior to scheduling his procedure. If he is felt by gastroenterology that it would be safe to proceed with cholecystectomy, and then we will go ahead and get him set up for cholecystectomy as soon as possible. I offered to refer him to ENT for evaluation of his large neck mass and possibly coordinate simultaneous excision of but he did not want to delay any surgery for his gallbladder and having his neck mass and evaluated by ENT. He also has a history of DVT and pulmonary embolus in 1999 so I would give him perioperative anticoagulation as well.     Plan  I have discussed his case with Dr. Madilyn Fireman (gastroenterology) and we will go ahead and set him up for MRCP to evaluate for possible choledocholithiasis preoperatively. If this is negative for choledocholithiasis and we will proceed with cholecystectomy. If this does demonstrate evidence of retained common bile duct stones, and he will have ERCP preoperatively.       Lodema Pilot DAVID 02/14/2012, 3:46 PM

## 2012-02-15 ENCOUNTER — Telehealth (INDEPENDENT_AMBULATORY_CARE_PROVIDER_SITE_OTHER): Payer: Self-pay

## 2012-02-15 NOTE — Telephone Encounter (Signed)
Patient scheduled for MRI of Chest/ABD/Pelvis w/MRCP for Friday 02/16/12 @ 9:45 am, [facility].  Patient aware that he cannot have have any solid food or liquids 4 hours prior to test.

## 2012-02-16 ENCOUNTER — Ambulatory Visit (HOSPITAL_COMMUNITY)
Admission: RE | Admit: 2012-02-16 | Discharge: 2012-02-16 | Disposition: A | Discharge status: Home / Self Care | Payer: BC Managed Care – PPO | Source: Ambulatory Visit | Attending: General Surgery | Admitting: General Surgery

## 2012-02-16 DIAGNOSIS — R110 Nausea: Secondary | ICD-10-CM | POA: Insufficient documentation

## 2012-02-16 DIAGNOSIS — K7689 Other specified diseases of liver: Secondary | ICD-10-CM | POA: Insufficient documentation

## 2012-02-16 DIAGNOSIS — K8689 Other specified diseases of pancreas: Secondary | ICD-10-CM | POA: Insufficient documentation

## 2012-02-16 DIAGNOSIS — N281 Cyst of kidney, acquired: Secondary | ICD-10-CM | POA: Insufficient documentation

## 2012-02-16 DIAGNOSIS — R7989 Other specified abnormal findings of blood chemistry: Secondary | ICD-10-CM | POA: Insufficient documentation

## 2012-02-16 DIAGNOSIS — R1011 Right upper quadrant pain: Secondary | ICD-10-CM | POA: Insufficient documentation

## 2012-02-16 DIAGNOSIS — K8020 Calculus of gallbladder without cholecystitis without obstruction: Secondary | ICD-10-CM | POA: Insufficient documentation

## 2012-02-16 LAB — CULTURE, BLOOD (ROUTINE X 2): Culture: NO GROWTH

## 2012-02-16 MED ORDER — GADOBENATE DIMEGLUMINE 529 MG/ML IV SOLN
20.0000 mL | Freq: Once | INTRAVENOUS | Status: AC | PRN
  Administered 2012-02-16: 20 mL via INTRAVENOUS

## 2012-02-19 ENCOUNTER — Encounter (INDEPENDENT_AMBULATORY_CARE_PROVIDER_SITE_OTHER): Payer: Self-pay | Admitting: General Surgery

## 2012-02-19 NOTE — Progress Notes (Signed)
Patient came in to the clinic today complaining of abdominal pain. I paged Dr. Biagio Quint who advised the patient can either go directly to the ER or schedule surgery with the schedulers. Dr. Biagio Quint provided two days this week that he was available to perform the lap chole w/IOC.  Advised patient of option and the patient wanted to talk to the surgery schedulers. Patient scheduled to have surgery on 02/21/12. Patient stated that he ate pizza last night and had a bacon, egg sandwich this morning and afterward he felt nauseous and had abdominal pain. I advised the patient to stay away from the fatty, fried foods and fast food.   Patient advised to contact his human resources department and advise when he is having surgery in order to have the paperwork brought in the office. Patient agreed.

## 2012-02-20 ENCOUNTER — Encounter (HOSPITAL_COMMUNITY): Payer: Self-pay

## 2012-02-20 ENCOUNTER — Encounter (HOSPITAL_COMMUNITY): Payer: Self-pay | Admitting: Pharmacy Technician

## 2012-02-20 MED ORDER — DEXTROSE 5 % IV SOLN
2.0000 g | INTRAVENOUS | Status: AC
  Administered 2012-02-21: 2 g via INTRAVENOUS

## 2012-02-21 ENCOUNTER — Ambulatory Visit (HOSPITAL_COMMUNITY)
Admission: RE | Admit: 2012-02-21 | Discharge: 2012-02-21 | Disposition: A | Discharge status: Home / Self Care | Payer: BC Managed Care – PPO | Source: Ambulatory Visit | Attending: General Surgery | Admitting: General Surgery

## 2012-02-21 ENCOUNTER — Encounter (HOSPITAL_COMMUNITY): Payer: Self-pay | Admitting: Certified Registered"

## 2012-02-21 ENCOUNTER — Encounter (HOSPITAL_COMMUNITY): Payer: Self-pay | Admitting: Surgery

## 2012-02-21 ENCOUNTER — Ambulatory Visit (HOSPITAL_COMMUNITY): Payer: BC Managed Care – PPO

## 2012-02-21 ENCOUNTER — Ambulatory Visit (HOSPITAL_COMMUNITY): Payer: BC Managed Care – PPO | Admitting: Certified Registered"

## 2012-02-21 ENCOUNTER — Encounter (HOSPITAL_COMMUNITY)
Admission: RE | Disposition: A | Discharge status: Home / Self Care | Payer: Self-pay | Source: Ambulatory Visit | Attending: General Surgery

## 2012-02-21 ENCOUNTER — Other Ambulatory Visit (INDEPENDENT_AMBULATORY_CARE_PROVIDER_SITE_OTHER): Payer: Self-pay | Admitting: General Surgery

## 2012-02-21 DIAGNOSIS — I10 Essential (primary) hypertension: Secondary | ICD-10-CM | POA: Insufficient documentation

## 2012-02-21 DIAGNOSIS — K8010 Calculus of gallbladder with chronic cholecystitis without obstruction: Secondary | ICD-10-CM

## 2012-02-21 DIAGNOSIS — K8020 Calculus of gallbladder without cholecystitis without obstruction: Secondary | ICD-10-CM | POA: Insufficient documentation

## 2012-02-21 HISTORY — PX: CHOLECYSTECTOMY: SHX55

## 2012-02-21 HISTORY — DX: Gastro-esophageal reflux disease without esophagitis: K21.9

## 2012-02-21 LAB — COMPREHENSIVE METABOLIC PANEL
ALT: 123 U/L — ABNORMAL HIGH (ref 0–53)
CO2: 25 mEq/L (ref 19–32)
Calcium: 9.6 mg/dL (ref 8.4–10.5)
Creatinine, Ser: 0.65 mg/dL (ref 0.50–1.35)
GFR calc Af Amer: >90 mL/min (ref >90)
GFR calc non Af Amer: >90 mL/min (ref >90)
Glucose, Bld: 99 mg/dL (ref 70–99)
Sodium: 142 mEq/L (ref 135–145)
Total Protein: 7.3 g/dL (ref 6.0–8.3)

## 2012-02-21 LAB — CBC WITH DIFFERENTIAL/PLATELET
Basophils Absolute: 0 10*3/uL (ref 0.0–0.1)
Eosinophils Absolute: 0.2 10*3/uL (ref 0.0–0.7)
Eosinophils Relative: 2 % (ref 0–5)
HCT: 39.3 % (ref 39.0–52.0)
Lymphs Abs: 1.5 10*3/uL (ref 0.7–4.0)
MCH: 27.2 pg (ref 26.0–34.0)
MCV: 80.4 fL (ref 78.0–100.0)
Monocytes Absolute: 0.4 10*3/uL (ref 0.1–1.0)
Platelets: 245 10*3/uL (ref 150–400)
RDW: 13.5 % (ref 11.5–15.5)

## 2012-02-21 LAB — PROTIME-INR: INR: 0.88 (ref 0.00–1.49)

## 2012-02-21 SURGERY — LAPAROSCOPIC CHOLECYSTECTOMY WITH INTRAOPERATIVE CHOLANGIOGRAM
Anesthesia: General | Site: Abdomen | Wound class: Contaminated

## 2012-02-21 MED ORDER — ALBUTEROL SULFATE HFA 108 (90 BASE) MCG/ACT IN AERS
INHALATION_SPRAY | RESPIRATORY_TRACT | Status: DC | PRN
  Administered 2012-02-21: 4 via RESPIRATORY_TRACT

## 2012-02-21 MED ORDER — LIDOCAINE HCL (CARDIAC) 20 MG/ML IV SOLN
INTRAVENOUS | Status: DC | PRN
  Administered 2012-02-21: 100 mg via INTRAVENOUS

## 2012-02-21 MED ORDER — SODIUM CHLORIDE 0.9 % IR SOLN
Status: DC | PRN
  Administered 2012-02-21: 1000 mL

## 2012-02-21 MED ORDER — FENTANYL CITRATE 0.05 MG/ML IJ SOLN
INTRAMUSCULAR | Status: DC | PRN
  Administered 2012-02-21 (×2): 50 ug via INTRAVENOUS
  Administered 2012-02-21: 100 ug via INTRAVENOUS
  Administered 2012-02-21: 50 ug via INTRAVENOUS

## 2012-02-21 MED ORDER — MUPIROCIN 2 % EX OINT
TOPICAL_OINTMENT | Freq: Two times a day (BID) | CUTANEOUS | Status: DC
  Administered 2012-02-21: 1 via NASAL

## 2012-02-21 MED ORDER — ROCURONIUM BROMIDE 100 MG/10ML IV SOLN
INTRAVENOUS | Status: DC | PRN
  Administered 2012-02-21: 50 mg via INTRAVENOUS

## 2012-02-21 MED ORDER — HYDROMORPHONE HCL PF 1 MG/ML IJ SOLN
0.2500 mg | INTRAMUSCULAR | Status: DC | PRN
  Administered 2012-02-21 (×2): 0.5 mg via INTRAVENOUS

## 2012-02-21 MED ORDER — OXYCODONE HCL 5 MG PO TABS: 5.0000 mg | ORAL_TABLET | ORAL | Status: DC | PRN

## 2012-02-21 MED ORDER — LACTATED RINGERS IV SOLN
INTRAVENOUS | Status: DC | PRN
  Administered 2012-02-21 (×2): via INTRAVENOUS

## 2012-02-21 MED ORDER — HYDROMORPHONE HCL PF 1 MG/ML IJ SOLN
INTRAMUSCULAR | Status: AC
  Filled 2012-02-21: qty 1

## 2012-02-21 MED ORDER — NEOSTIGMINE METHYLSULFATE 1 MG/ML IJ SOLN
INTRAMUSCULAR | Status: DC | PRN
  Administered 2012-02-21: 3 mg via INTRAVENOUS

## 2012-02-21 MED ORDER — ONDANSETRON HCL 4 MG/2ML IJ SOLN
INTRAMUSCULAR | Status: DC | PRN
  Administered 2012-02-21: 4 mg via INTRAVENOUS

## 2012-02-21 MED ORDER — LIDOCAINE-EPINEPHRINE (PF) 1 %-1:200000 IJ SOLN
INTRAMUSCULAR | Status: DC | PRN
  Administered 2012-02-21: 12:00:00

## 2012-02-21 MED ORDER — ENOXAPARIN SODIUM 40 MG/0.4ML ~~LOC~~ SOLN
40.0000 mg | Freq: Once | SUBCUTANEOUS | Status: AC
  Administered 2012-02-21: 40 mg via SUBCUTANEOUS
  Filled 2012-02-21: qty 0.4

## 2012-02-21 MED ORDER — OXYCODONE HCL 5 MG PO TABS
5.0000 mg | ORAL_TABLET | Freq: Once | ORAL | Status: AC
  Administered 2012-02-21: 5 mg via ORAL

## 2012-02-21 MED ORDER — OXYCODONE HCL 5 MG PO TABS
ORAL_TABLET | ORAL | Status: AC
  Filled 2012-02-21: qty 1

## 2012-02-21 MED ORDER — PROPOFOL 10 MG/ML IV BOLUS
INTRAVENOUS | Status: DC | PRN
  Administered 2012-02-21: 270 mg via INTRAVENOUS

## 2012-02-21 MED ORDER — METOCLOPRAMIDE HCL 5 MG/ML IJ SOLN: 10.0000 mg | Freq: Once | INTRAMUSCULAR | Status: DC | PRN

## 2012-02-21 MED ORDER — OXYCODONE HCL 5 MG PO TABS
5.0000 mg | ORAL_TABLET | Freq: Once | ORAL | Status: AC | PRN
  Administered 2012-02-21: 5 mg via ORAL

## 2012-02-21 MED ORDER — HYDROCODONE-ACETAMINOPHEN 5-325 MG PO TABS: 1.0000 | ORAL_TABLET | ORAL | Status: DC | PRN

## 2012-02-21 MED ORDER — BUPIVACAINE HCL (PF) 0.25 % IJ SOLN
INTRAMUSCULAR | Status: AC
  Filled 2012-02-21: qty 30

## 2012-02-21 MED ORDER — MUPIROCIN 2 % EX OINT
TOPICAL_OINTMENT | CUTANEOUS | Status: AC
  Administered 2012-02-21: 1 via NASAL
  Filled 2012-02-21: qty 22

## 2012-02-21 MED ORDER — DEXAMETHASONE SODIUM PHOSPHATE 4 MG/ML IJ SOLN
INTRAMUSCULAR | Status: DC | PRN
  Administered 2012-02-21: 8 mg via INTRAVENOUS

## 2012-02-21 MED ORDER — MIDAZOLAM HCL 5 MG/5ML IJ SOLN
INTRAMUSCULAR | Status: DC | PRN
  Administered 2012-02-21: 2 mg via INTRAVENOUS

## 2012-02-21 MED ORDER — VECURONIUM BROMIDE 10 MG IV SOLR
INTRAVENOUS | Status: DC | PRN
  Administered 2012-02-21 (×2): 1 mg via INTRAVENOUS

## 2012-02-21 MED ORDER — 0.9 % SODIUM CHLORIDE (POUR BTL) OPTIME
TOPICAL | Status: DC | PRN
  Administered 2012-02-21: 1000 mL

## 2012-02-21 MED ORDER — CEFAZOLIN SODIUM-DEXTROSE 2-3 GM-% IV SOLR
INTRAVENOUS | Status: AC
  Filled 2012-02-21: qty 50

## 2012-02-21 MED ORDER — METOCLOPRAMIDE HCL 5 MG/ML IJ SOLN
INTRAMUSCULAR | Status: DC | PRN
  Administered 2012-02-21: 10 mg via INTRAVENOUS

## 2012-02-21 MED ORDER — GLYCOPYRROLATE 0.2 MG/ML IJ SOLN
INTRAMUSCULAR | Status: DC | PRN
  Administered 2012-02-21: 0.4 mg via INTRAVENOUS

## 2012-02-21 MED ORDER — SODIUM CHLORIDE 0.9 % IV SOLN
INTRAVENOUS | Status: DC | PRN
  Administered 2012-02-21: 12:00:00

## 2012-02-21 MED ORDER — OXYCODONE HCL 5 MG/5ML PO SOLN: 5.0000 mg | Freq: Once | ORAL | Status: AC | PRN

## 2012-02-21 MED ORDER — LIDOCAINE-EPINEPHRINE (PF) 1 %-1:200000 IJ SOLN
INTRAMUSCULAR | Status: AC
  Filled 2012-02-21: qty 10

## 2012-02-21 SURGICAL SUPPLY — 49 items
APPLICATOR COTTON TIP 6IN STRL (MISCELLANEOUS) ×2 IMPLANT
APPLIER CLIP ROT 10 11.4 M/L (STAPLE) ×2
BLADE SURG ROTATE 9660 (MISCELLANEOUS) IMPLANT
CANISTER SUCTION 2500CC (MISCELLANEOUS) ×2 IMPLANT
CATH REDDICK CHOLANGI 4FR 50CM (CATHETERS) ×2 IMPLANT
CHLORAPREP W/TINT 26ML (MISCELLANEOUS) ×2 IMPLANT
CLIP APPLIE ROT 10 11.4 M/L (STAPLE) ×1 IMPLANT
CLOTH BEACON ORANGE TIMEOUT ST (SAFETY) ×2 IMPLANT
COVER SURGICAL LIGHT HANDLE (MISCELLANEOUS) ×2 IMPLANT
DECANTER SPIKE VIAL GLASS SM (MISCELLANEOUS) ×4 IMPLANT
DERMABOND ADVANCED (GAUZE/BANDAGES/DRESSINGS) ×1
DERMABOND ADVANCED .7 DNX12 (GAUZE/BANDAGES/DRESSINGS) ×1 IMPLANT
DRAPE C-ARM 42X72 X-RAY (DRAPES) ×2 IMPLANT
ELECT CAUTERY BLADE 6.4 (BLADE) ×2 IMPLANT
ELECT REM PT RETURN 9FT ADLT (ELECTROSURGICAL) ×2
ELECTRODE REM PT RTRN 9FT ADLT (ELECTROSURGICAL) ×1 IMPLANT
ENDOLOOP SUT PDS II  0 18 (SUTURE) ×1
ENDOLOOP SUT PDS II 0 18 (SUTURE) ×1 IMPLANT
GLOVE BIO SURGEON STRL SZ7 (GLOVE) ×2 IMPLANT
GLOVE BIOGEL PI IND STRL 6 (GLOVE) ×1 IMPLANT
GLOVE BIOGEL PI IND STRL 7.0 (GLOVE) ×2 IMPLANT
GLOVE BIOGEL PI IND STRL 7.5 (GLOVE) ×1 IMPLANT
GLOVE BIOGEL PI INDICATOR 6 (GLOVE) ×1
GLOVE BIOGEL PI INDICATOR 7.0 (GLOVE) ×2
GLOVE BIOGEL PI INDICATOR 7.5 (GLOVE) ×1
GLOVE SURG SS PI 6.5 STRL IVOR (GLOVE) ×4 IMPLANT
GLOVE SURG SS PI 7.5 STRL IVOR (GLOVE) ×4 IMPLANT
GOWN STRL NON-REIN LRG LVL3 (GOWN DISPOSABLE) ×8 IMPLANT
GOWN STRL REIN XL XLG (GOWN DISPOSABLE) ×2 IMPLANT
IV CATH 14GX2 1/4 (CATHETERS) ×2 IMPLANT
KIT BASIN OR (CUSTOM PROCEDURE TRAY) ×2 IMPLANT
KIT ROOM TURNOVER OR (KITS) ×2 IMPLANT
NS IRRIG 1000ML POUR BTL (IV SOLUTION) ×2 IMPLANT
PAD ARMBOARD 7.5X6 YLW CONV (MISCELLANEOUS) ×2 IMPLANT
PENCIL BUTTON HOLSTER BLD 10FT (ELECTRODE) ×2 IMPLANT
POUCH SPECIMEN RETRIEVAL 10MM (ENDOMECHANICALS) ×2 IMPLANT
SCISSORS LAP 5X35 DISP (ENDOMECHANICALS) IMPLANT
SET IRRIG TUBING LAPAROSCOPIC (IRRIGATION / IRRIGATOR) ×2 IMPLANT
SLEEVE ENDOPATH XCEL 5M (ENDOMECHANICALS) ×2 IMPLANT
SPECIMEN JAR SMALL (MISCELLANEOUS) ×2 IMPLANT
SUT MNCRL AB 4-0 PS2 18 (SUTURE) ×4 IMPLANT
SUT VICRYL 0 UR6 27IN ABS (SUTURE) ×4 IMPLANT
TOWEL OR 17X24 6PK STRL BLUE (TOWEL DISPOSABLE) ×2 IMPLANT
TOWEL OR 17X26 10 PK STRL BLUE (TOWEL DISPOSABLE) ×2 IMPLANT
TRAY FOLEY CATH 14FR (SET/KITS/TRAYS/PACK) IMPLANT
TRAY LAPAROSCOPIC (CUSTOM PROCEDURE TRAY) ×2 IMPLANT
TROCAR BALLN 12MMX100 BLUNT (TROCAR) ×2 IMPLANT
TROCAR XCEL NON-BLD 11X100MML (ENDOMECHANICALS) ×2 IMPLANT
TROCAR XCEL NON-BLD 5MMX100MML (ENDOMECHANICALS) ×2 IMPLANT

## 2012-02-21 NOTE — Progress Notes (Signed)
DR Biagio Quint IN TO GIVE CLIENT SCRIPT

## 2012-02-21 NOTE — Brief Op Note (Signed)
02/21/2012  2:03 PM  PATIENT:  Mike Webb  42 y.o. male  PRE-OPERATIVE DIAGNOSIS:  gallstones  POST-OPERATIVE DIAGNOSIS:  gallstones  PROCEDURE:  Procedure(s) (LRB) with comments: LAPAROSCOPIC CHOLECYSTECTOMY WITH INTRAOPERATIVE CHOLANGIOGRAM (N/A)  SURGEON:  Surgeon(s) and Role:    * Lodema Pilot, DO - Primary    * Wilmon Arms. Corliss Skains, MD - Assisting  PHYSICIAN ASSISTANT:   ASSISTANTS: Tsuei   ANESTHESIA:   general  EBL:  Total I/O In: 1550 [I.V.:1550] Out: 50 [Blood:50]  BLOOD ADMINISTERED:none  DRAINS: none   LOCAL MEDICATIONS USED:  MARCAINE    and LIDOCAINE   SPECIMEN:  Source of Specimen:  gallbladder  DISPOSITION OF SPECIMEN:  PATHOLOGY  COUNTS:  YES  TOURNIQUET:  * No tourniquets in log *  DICTATION: .Other Dictation: Dictation Number   PLAN OF CARE: Discharge to home after PACU  PATIENT DISPOSITION:  PACU - hemodynamically stable.   Delay start of Pharmacological VTE agent (>24hrs) due to surgical blood loss or risk of bleeding: no

## 2012-02-21 NOTE — Preoperative (Signed)
Beta Blockers   Reason not to administer Beta Blockers:Not Applicable 

## 2012-02-21 NOTE — Interval H&P Note (Signed)
History and Physical Interval Note:  02/21/2012 11:54 AM  Franco Nones  has presented today for surgery, with the diagnosis of gallstones  The various methods of treatment have been discussed with the patient and family. After consideration of risks, benefits and other options for treatment, the patient has consented to  Procedure(s) (LRB) with comments: LAPAROSCOPIC CHOLECYSTECTOMY WITH INTRAOPERATIVE CHOLANGIOGRAM (N/A) as a surgical intervention .  The patient's history has been reviewed, patient examined, no change in status, stable for surgery.  I have reviewed the patient's chart and labs.  Questions were answered to the patient's satisfaction.  MRCP without choledocholithiasis.  He is feeling a little better now but still having some discomfort. I discussed with him the risks of the procedure.  The risks of infection, bleeding, pain, persistent symptoms, scarring, injury to bowel or bile ducts, retained stone, diarrhea, need for additional procedures, and need for open surgery discussed with the patient.    Lodema Pilot DAVID

## 2012-02-21 NOTE — H&P (View-Only) (Signed)
Patient ID: Mike Webb, male   DOB: 07/24/1970, 41 y.o.   MRN: [id number]  Chief Complaint  Patient presents with  . Follow-up    G. B /left neck lipoma    HPI Mike Webb is a 41 y.o. male.  This patient is referred by Dr. Anderson for evaluation of cholelithiasis and elevated liver function tests.  He was admitted last week to the hospital for abdominal pain and elevated liver function tests. He was found to have cholelithiasis at that time but there was concern that his elevated LFTs might be due to Tylenol toxicity which he had been taking for neck pain. He says that he had been taking about 4500 mg per day. He also says that he does have right upper quarter of abdominal pain which occurs most anytime that he. He says that he has had this for some time and has about 2-3 episodes each year which lasted a few weeks and at times where he had these intermittent episodes of right upper quadrant pain which radiates to his back after eating. This most recent episode has lasted about 3 weeks. He has some associated nausea but has not been throwing up. He does not have any fevers or chills and denies any jaundice. He drinks about 5 drinks per week but has only had a half a beer since his hospitalization. HPI  Past Medical History  Diagnosis Date  . Neck mass   . Clotting disorder   . Sickle cell trait   . Pulmonary embolism   . Cancer     Past Surgical History  Procedure Date  . Surgery scrotal / testicular     No family history on file.  Social History History  Substance Use Topics  . Smoking status: Current Every Day Smoker -- 0.5 packs/day  . Smokeless tobacco: Not on file  . Alcohol Use: 6.0 oz/week    12 drink(s) per week    No Known Allergies  Current Outpatient Prescriptions  Medication Sig Dispense Refill  . aspirin 81 MG chewable tablet Chew 1 tablet (81 mg total) by mouth daily.      . ciprofloxacin (CIPRO) 500 MG tablet Take 1 tablet (500 mg total) by mouth 2 (two)  times daily.  20 tablet  0  . clorazepate (TRANXENE) 7.5 MG tablet Take 7.5 mg by mouth 2 (two) times daily.      . cyclobenzaprine (FLEXERIL) 10 MG tablet Take 10 mg by mouth 3 (three) times daily as needed. For muscle spasms      . ergocalciferol (VITAMIN D2) 50000 UNITS capsule Take 50,000 Units by mouth once a week.      . escitalopram (LEXAPRO) 10 MG tablet Take 10 mg by mouth daily.      . ibuprofen (ADVIL,MOTRIN) 200 MG tablet Take 2 tablets (400 mg total) by mouth every 8 (eight) hours as needed. For pain  45 tablet  0  . lisinopril (PRINIVIL,ZESTRIL) 20 MG tablet Take 20 mg by mouth daily.      . Multiple Vitamin (MULTIVITAMIN WITH MINERALS) TABS Take 1 tablet by mouth daily.      . pantoprazole (PROTONIX) 40 MG tablet Take 1 tablet (40 mg total) by mouth 2 (two) times daily.  60 tablet  1  . Testosterone (ANDROGEL PUMP) 20.25 MG/ACT (1.62%) GEL Place 1 application onto the skin 2 (two) times daily.      . traMADol (ULTRAM) 50 MG tablet Take 1 tablet (50 mg total) by mouth every 6 (six) hours   as needed for pain (severe pain, no relieved by ibuprofen).  45 tablet  0  . Varenicline Tartrate (CHANTIX PO) Take 1 mg by mouth 2 (two) times daily.       . zolpidem (AMBIEN) 10 MG tablet Take 10 mg by mouth at bedtime as needed. For sleep      . promethazine (PHENERGAN) 25 MG tablet         Review of Systems Review of Systems All other review of systems negative or noncontributory except as stated in the HPI  Blood pressure 128/74, pulse 83, temperature 97.8 F (36.6 C), resp. rate 18, height 5' 7.5" (1.715 m), weight 248 lb 9.6 oz (112.764 kg).  Physical Exam Physical Exam Physical Exam  Vitals reviewed. Constitutional: He is oriented to person, place, and time. He appears well-developed and well-nourished. No distress.  HENT:  Head: Normocephalic and atraumatic.  Mouth/Throat: No oropharyngeal exudate.  Eyes: Conjunctivae and EOM are normal. Pupils are equal, round, and reactive to  light. Right eye exhibits no discharge. Left eye exhibits no discharge. No scleral icterus.  Neck: Normal range of motion. No tracheal deviation present. He does have a large 8-10cm palpable soft tissue mass in the left neck/trapezius region consistent with known lipoma Cardiovascular: Normal rate, regular rhythm and normal heart sounds.   Pulmonary/Chest: Effort normal and breath sounds normal. No stridor. No respiratory distress. He has no wheezes. He has no rales. He exhibits no tenderness.  Abdominal: Soft. Bowel sounds are normal. He exhibits no distension and no mass. There is mild  Tenderness in the RUQ. There is no rebound and no guarding.  Musculoskeletal: Normal range of motion. He exhibits no edema and no tenderness.  Neurological: He is alert and oriented to person, place, and time.  Skin: Skin is warm and dry. No rash noted. He is not diaphoretic. No erythema. No pallor.  Psychiatric: He has a normal mood and affect. His behavior is normal. Judgment and thought content normal.     Data Reviewed Labs, hospital records, [place], [place]  Assessment    Cholelithiasis and elevated liver function tests I think that this is likely related to his cholelithiasis and possible cholecystitis or choledocholithiasis. He does have postprandial right upper quadrant pain which is consistent with cholelithiasis. However, the Recent hospitalization with questionable Tylenol toxicity confounds the issue.  His elevated liver function tests could be related to this as well. His LFT seem to be improving but he does continue to have right upper quadrant abdominal pain and I think it is reasonable to proceed with cholecystectomy. We did discuss the procedure and its risks. The risks of infection, bleeding, pain, persistent symptoms, scarring, injury to bowel or bile ducts, retained stone, diarrhea, need for additional procedures, and need for open surgery discussed with the patient.  I explained that I would like to  review his case with gastroenterology prior to scheduling his procedure. If he is felt by gastroenterology that it would be safe to proceed with cholecystectomy, and then we will go ahead and get him set up for cholecystectomy as soon as possible. I offered to refer him to ENT for evaluation of his large neck mass and possibly coordinate simultaneous excision of but he did not want to delay any surgery for his gallbladder and having his neck mass and evaluated by ENT. He also has a history of DVT and pulmonary embolus in 1999 so I would give him perioperative anticoagulation as well.     Plan      I have discussed his case with Dr. Hayes (gastroenterology) and we will go ahead and set him up for MRCP to evaluate for possible choledocholithiasis preoperatively. If this is negative for choledocholithiasis and we will proceed with cholecystectomy. If this does demonstrate evidence of retained common bile duct stones, and he will have ERCP preoperatively.       Gearl Baratta DAVID 02/14/2012, 3:46 PM    

## 2012-02-21 NOTE — Progress Notes (Signed)
Nurse paged Dr. Biagio Quint to confirm that he wanted patient to have 40 mg Lovenox injection. Dr. Biagio Quint stated he wanted patient to have it. Will administer as ordered.

## 2012-02-21 NOTE — Anesthesia Procedure Notes (Signed)
Procedure Name: Intubation Date/Time: 02/21/2012 12:10 PM Performed by: Jerilee Hoh Pre-anesthesia Checklist: Patient identified, Emergency Drugs available, Suction available and Patient being monitored Patient Re-evaluated:Patient Re-evaluated prior to inductionOxygen Delivery Method: Circle system utilized Preoxygenation: Pre-oxygenation with 100% oxygen Intubation Type: IV induction Ventilation: Mask ventilation without difficulty and Oral airway inserted - appropriate to patient size Laryngoscope Size: Mac and 4 Grade View: Grade II Tube type: Oral Tube size: 7.5 mm Number of attempts: 1 Airway Equipment and Method: Stylet Placement Confirmation: ETT inserted through vocal cords under direct vision,  positive ETCO2 and breath sounds checked- equal and bilateral Secured at: 23 cm Tube secured with: Tape Dental Injury: Teeth and Oropharynx as per pre-operative assessment

## 2012-02-21 NOTE — Anesthesia Preprocedure Evaluation (Signed)
Anesthesia Evaluation  Patient identified by MRN, date of birth, ID band Patient awake    Reviewed: Allergy & Precautions, H&P , NPO status , Patient's Chart, lab work & pertinent test results, reviewed documented beta blocker date and time   Airway Mallampati: II TM Distance: >3 FB Neck ROM: full    Dental   Pulmonary neg pulmonary ROS,  breath sounds clear to auscultation        Cardiovascular hypertension, On Medications Rhythm:regular     Neuro/Psych PSYCHIATRIC DISORDERS TIA   GI/Hepatic negative GI ROS, Neg liver ROS, GERD-  Medicated and Controlled,  Endo/Other  negative endocrine ROSMorbid obesity  Renal/GU negative Renal ROS  negative genitourinary   Musculoskeletal   Abdominal   Peds  Hematology  (+) Sickle cell trait ,   Anesthesia Other Findings See surgeon's H&P   Reproductive/Obstetrics negative OB ROS                           Anesthesia Physical Anesthesia Plan  ASA: III  Anesthesia Plan: General   Post-op Pain Management:    Induction: Intravenous  Airway Management Planned: Oral ETT  Additional Equipment:   Intra-op Plan:   Post-operative Plan: Extubation in OR  Informed Consent: I have reviewed the patients History and Physical, chart, labs and discussed the procedure including the risks, benefits and alternatives for the proposed anesthesia with the patient or authorized representative who has indicated his/her understanding and acceptance.   Dental Advisory Given  Plan Discussed with: CRNA and Surgeon  Anesthesia Plan Comments:         Anesthesia Quick Evaluation

## 2012-02-21 NOTE — Transfer of Care (Signed)
Immediate Anesthesia Transfer of Care Note  Patient: Mike Webb  Procedure(s) Performed: Procedure(s) (LRB) with comments: LAPAROSCOPIC CHOLECYSTECTOMY WITH INTRAOPERATIVE CHOLANGIOGRAM (N/A)  Patient Location: PACU  Anesthesia Type:General  Level of Consciousness: sedated and responds to stimulation  Airway & Oxygen Therapy: Patient Spontanous Breathing and Patient connected to face mask oxygen  Post-op Assessment: Report given to PACU RN and Post -op Vital signs reviewed and stable  Post vital signs: Reviewed and stable  Complications: No apparent anesthesia complications

## 2012-02-22 ENCOUNTER — Encounter (HOSPITAL_COMMUNITY): Payer: Self-pay | Admitting: General Surgery

## 2012-02-22 ENCOUNTER — Telehealth (INDEPENDENT_AMBULATORY_CARE_PROVIDER_SITE_OTHER): Payer: Self-pay | Admitting: General Surgery

## 2012-02-22 NOTE — Anesthesia Postprocedure Evaluation (Signed)
  Anesthesia Post-op Note  Patient: Mike Webb  Procedure(s) Performed: Procedure(s) (LRB) with comments: LAPAROSCOPIC CHOLECYSTECTOMY WITH INTRAOPERATIVE CHOLANGIOGRAM (N/A)  Patient Location: PACU  Anesthesia Type:General  Level of Consciousness: awake and alert   Airway and Oxygen Therapy: Patient Spontanous Breathing  Post-op Pain: mild  Post-op Assessment: Post-op Vital signs reviewed, Patient's Cardiovascular Status Stable, Respiratory Function Stable, Patent Airway, No signs of Nausea or vomiting and Pain level controlled  Post-op Vital Signs: stable  Complications: No apparent anesthesia complications
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
## 2012-02-22 NOTE — Op Note (Signed)
NAME:  LYNX, GOODRICH NO.:  [id number]  MEDICAL RECORD NO.:  [id number]  LOCATION:  [facility]                         FACILITY:  [facility]  PHYSICIAN:  Lodema Pilot, MD       DATE OF BIRTH:  [birth year]  DATE OF PROCEDURE:  02/21/2012 DATE OF DISCHARGE:  02/21/2012                              OPERATIVE REPORT   PROCEDURE:  Laparoscopic cholecystectomy with intraoperative cholangiogram.  PREOPERATIVE DIAGNOSIS:  Cholelithiasis.  POSTOPERATIVE DIAGNOSIS:  Cholelithiasis.  SURGEON:  Lodema Pilot, MD.  ASSISTANT:  Dr. Harlon Flor  ANESTHESIA:  General endotracheal anesthesia with 27 mL of 1% lidocaine with epinephrine and 0.25% Marcaine in a 50:50 mixture.  FLUIDS:  1500 mL of crystalloid.  ESTIMATED BLOOD LOSS:  50 mL.  DRAINS:  None.  SPECIMENS:  Gallbladder and contents sent to Pathology for permanent section.  COMPLICATIONS:  None apparent.  FINDINGS:  Multiple gallstones with some gallstones milked retrograde through the cystic duct.  Normal cholangiogram.  INDICATION FOR PROCEDURE:  Mr. Gras is a 42 year old male who was recently admitted with elevated LFTs and was concerned for possible Tylenol toxicity, but he was also found to have gallstones.  He has had persistent abdominal pain despite improvement of his hepatitis and desires cholecystectomy.  OPERATIVE DETAILS:  Mr. Fawaz was seen and evaluated in the preoperative area and risks and benefits of the procedure were again discussed in lay terms.  Informed consent was obtained.  He was given subcu Lovenox for DVT prophylaxis, and prophylactic antibiotics and was taken to the operating room, placed on the table in supine position. General endotracheal anesthesia was obtained and his abdomen was prepped and draped in standard surgical fashion.  Procedure time-out was performed with all operative team members to confirm proper patient, procedure, and the supraumbilical midline incision was made in the  skin and dissection carried down to the subcutaneous tissue using blunt dissection.  The abdominal wall fascia was elevated and sharply incised and the peritoneum was entered bluntly.  A 12-mm balloon port was placed at the umbilicus and pneumoperitoneum was obtained.  Laparoscope was introduced and there was no evidence of bowel injury upon entry.  Then, a 11-mm epigastric trocar was placed and two 5-mm right upper quadrant trocars were placed under direct visualization and gallbladder was retracted cephalad.  He had omentum which was adherent to the gallbladder and this was taken down with sharp dissection, as well as some blunt dissection, but no cautery was used for this process. This allowed me to elevate the gallbladder cephalad and further take down the adhesions to the gallbladder.  These were consistent with prior inflammatory process.  The colon was nearby, again sharp dissection was used to separate the adhesions to the gallbladder.  Also the duodenum was divided and no cautery was used in this area.  Then, I was able to dissect the peritoneum off the gallbladder.  The cystic duct was identified and skeletonized using blunt dissection.  A clip was placed on the gallbladder side of the cystic duct and a small cystic ductotomy was made.  The cholangiogram catheter was passed through the abdominal wall through a separate stab incision.  It was placed in the  cystic ductotomy; although, the cholangiogram was very difficult to obtain. Multiple attempts were needed to get the catheter to enter the cystic duct.  I kept knocking back several small gallstones from the cystic duct stones.  Finally, we were able to get the cholangiogram catheter passed in order to do a cholangiogram, which demonstrated no filling defects in the common bile duct and flow of bile into the duodenum, as well as normal right and left hepatic ducts.  The catheter was removed and a clip was placed on the 6th  cystic duct stump and a 0 PDS Endo-loop was also used to secure the cystic duct stump, and the gallbladder was dissected free from the gallbladder fossa using blunt dissection and Bovie electrocautery.  I never identified a clear cystic artery, but there was no other significant arteries identified during the removal of the gallbladder from the gallbladder fossa.  Gallbladder was not entered during the dissection and it was completely removed and placed in an EndoCatch bag and removed from the umbilical trocar site, however, the skin and fascial incisions had to be enlarged in order to accommodate retrieval of the gallbladder due to the large gallstones as well as multiple small gallstones.  The balloon port was placed back at the umbilicus and the gallbladder fossa was inspected for hemostasis, which was noted be adequate.  A few clips were placed on the omentum also for hemostasis.  The right upper quadrant was irrigated with sterile saline solution and irrigation returned clear.  The right upper quadrant ports were removed.  The abdominal wall was noted be hemostatic.  The umbilical fascia was then approximated with several interrupted 0 Vicryl sutures in open fashion and the suture was secured and the abdomen was re-insufflated with carbon dioxide gas at the epigastric trocar site. The abdominal wall closure was noted be adequate.  There was no evidence of bleeding or bowel injury.  The abdominal wall as well as in the right upper quadrant.  The final trocar was removed and the wounds were injected with 27 mL of 1% lidocaine with epinephrine and 0.25% Marcaine in a 50:50 mixture.  Skin edges were approximated with 4-0 Monocryl subcuticular suture.  Skin was washed and dried, and Dermabond was applied.  The patient tolerated the procedure well without any apparent complications.  All sponge, needle, and instrument counts were correct in the case.  He was ready for transfer to the  recovery room in stable condition.          ______________________________ Lodema Pilot, MD     BL/MEDQ  D:  02/21/2012  T:  02/22/2012  Job:  [id number]
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
## 2012-02-23 ENCOUNTER — Telehealth (INDEPENDENT_AMBULATORY_CARE_PROVIDER_SITE_OTHER): Payer: Self-pay | Admitting: General Surgery
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
## 2012-03-07 ENCOUNTER — Encounter (INDEPENDENT_AMBULATORY_CARE_PROVIDER_SITE_OTHER): Payer: BC Managed Care – PPO | Admitting: General Surgery

## 2012-03-08 ENCOUNTER — Encounter (INDEPENDENT_AMBULATORY_CARE_PROVIDER_SITE_OTHER): Payer: Self-pay

## 2012-03-08 ENCOUNTER — Encounter (INDEPENDENT_AMBULATORY_CARE_PROVIDER_SITE_OTHER): Payer: BC Managed Care – PPO | Admitting: General Surgery

## 2012-11-04 ENCOUNTER — Encounter (HOSPITAL_COMMUNITY): Payer: Self-pay | Admitting: Emergency Medicine

## 2012-11-04 ENCOUNTER — Emergency Department (HOSPITAL_COMMUNITY)
Admission: EM | Admit: 2012-11-04 | Discharge: 2012-11-04 | Disposition: A | Discharge status: Home / Self Care | Payer: BC Managed Care – PPO | Attending: Emergency Medicine | Admitting: Emergency Medicine

## 2012-11-04 DIAGNOSIS — R1012 Left upper quadrant pain: Secondary | ICD-10-CM | POA: Insufficient documentation

## 2012-11-04 DIAGNOSIS — R112 Nausea with vomiting, unspecified: Secondary | ICD-10-CM | POA: Insufficient documentation

## 2012-11-04 DIAGNOSIS — K219 Gastro-esophageal reflux disease without esophagitis: Secondary | ICD-10-CM | POA: Insufficient documentation

## 2012-11-04 DIAGNOSIS — R1013 Epigastric pain: Secondary | ICD-10-CM | POA: Insufficient documentation

## 2012-11-04 DIAGNOSIS — Z79899 Other long term (current) drug therapy: Secondary | ICD-10-CM | POA: Insufficient documentation

## 2012-11-04 DIAGNOSIS — Z86711 Personal history of pulmonary embolism: Secondary | ICD-10-CM | POA: Insufficient documentation

## 2012-11-04 DIAGNOSIS — Z9089 Acquired absence of other organs: Secondary | ICD-10-CM | POA: Insufficient documentation

## 2012-11-04 DIAGNOSIS — R61 Generalized hyperhidrosis: Secondary | ICD-10-CM | POA: Insufficient documentation

## 2012-11-04 DIAGNOSIS — R109 Unspecified abdominal pain: Secondary | ICD-10-CM

## 2012-11-04 DIAGNOSIS — Z7982 Long term (current) use of aspirin: Secondary | ICD-10-CM | POA: Insufficient documentation

## 2012-11-04 DIAGNOSIS — Z859 Personal history of malignant neoplasm, unspecified: Secondary | ICD-10-CM | POA: Insufficient documentation

## 2012-11-04 DIAGNOSIS — Z862 Personal history of diseases of the blood and blood-forming organs and certain disorders involving the immune mechanism: Secondary | ICD-10-CM | POA: Insufficient documentation

## 2012-11-04 DIAGNOSIS — F17200 Nicotine dependence, unspecified, uncomplicated: Secondary | ICD-10-CM | POA: Insufficient documentation

## 2012-11-04 LAB — COMPREHENSIVE METABOLIC PANEL
BUN: 10 mg/dL (ref 6–23)
CO2: 24 mEq/L (ref 19–32)
Calcium: 9.4 mg/dL (ref 8.4–10.5)
GFR calc Af Amer: >90 mL/min (ref >90)
GFR calc non Af Amer: >90 mL/min (ref >90)
Glucose, Bld: 87 mg/dL (ref 70–99)
Total Protein: 8.4 g/dL — ABNORMAL HIGH (ref 6.0–8.3)

## 2012-11-04 LAB — CBC WITH DIFFERENTIAL/PLATELET
Eosinophils Absolute: 0.2 10*3/uL (ref 0.0–0.7)
Eosinophils Relative: 3 % (ref 0–5)
HCT: 46.6 % (ref 39.0–52.0)
Hemoglobin: 16.1 g/dL (ref 13.0–17.0)
Lymphs Abs: 0.4 10*3/uL — ABNORMAL LOW (ref 0.7–4.0)
MCH: 26.9 pg (ref 26.0–34.0)
MCV: 77.8 fL — ABNORMAL LOW (ref 78.0–100.0)
Monocytes Absolute: 0.2 10*3/uL (ref 0.1–1.0)
Monocytes Relative: 4 % (ref 3–12)
RBC: 5.99 MIL/uL — ABNORMAL HIGH (ref 4.22–5.81)

## 2012-11-04 LAB — POCT I-STAT TROPONIN I

## 2012-11-04 LAB — LIPASE, BLOOD: Lipase: 14 U/L (ref 11–59)

## 2012-11-04 MED ORDER — SODIUM CHLORIDE 0.9 % IV BOLUS (SEPSIS)
1000.0000 mL | Freq: Once | INTRAVENOUS | Status: AC
  Administered 2012-11-04: 1000 mL via INTRAVENOUS

## 2012-11-04 MED ORDER — ONDANSETRON 4 MG PO TBDP: 4.0000 mg | ORAL_TABLET | Freq: Three times a day (TID) | ORAL | Status: DC | PRN

## 2012-11-04 MED ORDER — MORPHINE SULFATE 4 MG/ML IJ SOLN
4.0000 mg | Freq: Once | INTRAMUSCULAR | Status: AC
  Administered 2012-11-04: 4 mg via INTRAVENOUS
  Filled 2012-11-04: qty 1

## 2012-11-04 MED ORDER — ONDANSETRON HCL 4 MG/2ML IJ SOLN
4.0000 mg | Freq: Once | INTRAMUSCULAR | Status: AC
  Administered 2012-11-04: 4 mg via INTRAVENOUS
  Filled 2012-11-04: qty 2

## 2012-11-04 NOTE — ED Notes (Signed)
Pt reports he is still feeling nauseous, pt has stopped vomiting at this time. Pt informed we are still waiting on IV team to come start an IV so we can give him meds. Pt in nad, skin warm and dry, resp e/u.

## 2012-11-04 NOTE — ED Notes (Signed)
Still no response from IV team. Re-paged IV team.

## 2012-11-04 NOTE — ED Provider Notes (Signed)
Medical screening examination/treatment/procedure(s) were performed by non-physician practitioner and as supervising physician I was immediately available for consultation/collaboration.   Darlys Gales, MD 11/04/12 2201

## 2012-11-04 NOTE — ED Notes (Signed)
11/03/12 1800 patient started having abd pain and nausea and vomiting about after eating at a local [employer]. Pt try to wait out the pain and go to a urgent care in the am,but pain worsening had come to the er instead

## 2012-11-04 NOTE — ED Notes (Signed)
Vomiting and diarrhea since 1800.  abd pain from vomiting

## 2012-11-04 NOTE — ED Notes (Signed)
Pt finished getting dressed, but making a phone call. sts he will leave after that.

## 2012-11-04 NOTE — ED Provider Notes (Signed)
CSN: [id number]     Arrival date & time 11/04/12  0536 History   First MD Initiated Contact with Patient 11/04/12 0602     Chief Complaint  Patient presents with  . Emesis   (Consider location/radiation/quality/duration/timing/severity/associated sxs/prior Treatment) Patient is a 42 y.o. male presenting with vomiting. The history is provided by the patient and medical records.  Emesis Associated symptoms: abdominal pain and diarrhea    This is a 42 y.o. Male with PMH significant for clotting disorder, PE, cancer, GERD, presenting to the ED for abdominal pain, N/V/D, after eating [employer] yesterday evening around 1800.  No blood in stool or emesis.  Pt states he ate a large cheeseburger and fries, initially felt fine afterwards.  States he does remember the burger tasting "a little funny".  No one else ate at the restaurant with him.  No fever, but states he feels chilled.  Prior cholecystectomy.  Has not been able to tolerate anything PO since last night.  Denies any chest pain or SOB.  Past Medical History  Diagnosis Date  . Neck mass   . Clotting disorder   . Sickle cell trait   . Pulmonary embolism   . Cancer   . GERD (gastroesophageal reflux disease)     takes protonix   Past Surgical History  Procedure Laterality Date  . Surgery scrotal / testicular    . Cholecystectomy  02/21/2012    Procedure: LAPAROSCOPIC CHOLECYSTECTOMY WITH INTRAOPERATIVE CHOLANGIOGRAM;  Surgeon: Lodema Pilot, DO;  Location: [facility] OR;  Service: General;  Laterality: N/A;   History reviewed. No pertinent family history. History  Substance Use Topics  . Smoking status: Current Every Day Smoker -- 0.50 packs/day  . Smokeless tobacco: Not on file  . Alcohol Use: 6.0 oz/week    12 drink(s) per week    Review of Systems  Gastrointestinal: Positive for nausea, vomiting, abdominal pain and diarrhea.  All other systems reviewed and are negative.    Allergies  Review of patient's allergies indicates no  known allergies.  Home Medications   Current Outpatient Rx  Name  Route  Sig  Dispense  Refill  . aspirin 81 MG chewable tablet   Oral   Chew 1 tablet (81 mg total) by mouth daily.         . ciprofloxacin (CIPRO) 500 MG tablet   Oral   Take 500 mg by mouth 2 (two) times daily. Starting on 02/12/12 for 10 days         . clorazepate (TRANXENE) 7.5 MG tablet   Oral   Take 7.5 mg by mouth 2 (two) times daily.         . ergocalciferol (VITAMIN D2) 50000 UNITS capsule   Oral   Take 50,000 Units by mouth once a week. On Sunday         . escitalopram (LEXAPRO) 10 MG tablet   Oral   Take 10 mg by mouth daily.         Marland Kitchen ibuprofen (ADVIL,MOTRIN) 200 MG tablet   Oral   Take 2 tablets (400 mg total) by mouth every 8 (eight) hours as needed. For pain   45 tablet   0   . lisinopril (PRINIVIL,ZESTRIL) 20 MG tablet   Oral   Take 20 mg by mouth daily.         . Multiple Vitamin (MULTIVITAMIN WITH MINERALS) TABS   Oral   Take 1 tablet by mouth daily.         Marland Kitchen oxyCODONE (  ROXICODONE) 5 MG immediate release tablet   Oral   Take 1 tablet (5 mg total) by mouth every 4 (four) hours as needed for pain.   45 tablet   0   . pantoprazole (PROTONIX) 40 MG tablet   Oral   Take 1 tablet (40 mg total) by mouth 2 (two) times daily.   60 tablet   1   . promethazine (PHENERGAN) 25 MG tablet   Oral   Take 25 mg by mouth every 8 (eight) hours as needed. For nausea         . Testosterone (ANDROGEL PUMP) 20.25 MG/ACT (1.62%) GEL   Transdermal   Place 1 application onto the skin 2 (two) times daily.         . traMADol (ULTRAM) 50 MG tablet   Oral   Take 1 tablet (50 mg total) by mouth every 6 (six) hours as needed for pain (severe pain, no relieved by ibuprofen).   45 tablet   0   . varenicline (CHANTIX) 0.5 MG tablet   Oral   Take 0.5 mg by mouth 2 (two) times daily.         Marland Kitchen zolpidem (AMBIEN) 10 MG tablet   Oral   Take 10 mg by mouth at bedtime as needed. For  sleep          BP 162/98  Pulse 94  Temp(Src) 99.3 F (37.4 C) (Oral)  Resp 20  Ht 5\' 8"  (1.727 m)  Wt 250 lb (113.399 kg)  BMI 38.02 kg/m2  SpO2 99%  Physical Exam  Nursing note and vitals reviewed. Constitutional: He is oriented to person, place, and time. He appears well-developed and well-nourished. No distress.  Appears uncomfortable  HENT:  Head: Normocephalic and atraumatic.  Mouth/Throat: Oropharynx is clear and moist.  Eyes: Conjunctivae and EOM are normal. Pupils are equal, round, and reactive to light.  Neck: Normal range of motion.  Cardiovascular: Normal rate, regular rhythm and normal heart sounds.   Pulmonary/Chest: Effort normal and breath sounds normal.  Abdominal: Soft. Bowel sounds are normal. There is tenderness in the epigastric area and left upper quadrant. There is no rigidity, no guarding, no CVA tenderness and no tenderness at McBurney's point.  Musculoskeletal: Normal range of motion.  Neurological: He is alert and oriented to person, place, and time.  Skin: Skin is warm. He is diaphoretic.  Psychiatric: He has a normal mood and affect.    ED Course  Procedures (including critical care time) Labs Review Labs Reviewed  CBC WITH DIFFERENTIAL - Abnormal; Notable for the following:    RBC 5.99 (*)    MCV 77.8 (*)    Neutrophils Relative % 86 (*)    Lymphocytes Relative 7 (*)    Lymphs Abs 0.4 (*)    All other components within normal limits  COMPREHENSIVE METABOLIC PANEL - Abnormal; Notable for the following:    Total Protein 8.4 (*)    All other components within normal limits  LIPASE, BLOOD  POCT I-STAT TROPONIN I   Imaging Review No results found.  EKG Interpretation   None       MDM   1. Abdominal  pain, other specified site   2. Nausea and vomiting     Abdominal pain, N/V/D after eating [employer] last night.  Will check basic labs, give  IVF bolus and meds.  Will reassess.  9:12 AM Lab work reassuring. Pt  re-evaluated--sleeping in room, NAD.  Awoke pt, states he feels much better  without further vomiting.  Repeat abdominal exam improved without focal TTP.  I doubt acute or surgical abdomen at this time.  Pt afebrile, non-toxic appearing, NAD, VS stable- ok for discharge.  Rx zofran.  FU with PCP if problems occur.  Discussed plan with pt, they agreed.  Return precautions advised.  Garlon Hatchet, PA-C 11/04/12 [phone]

## 2012-11-04 NOTE — ED Notes (Signed)
This nurse unable to obtain IV.

## 2012-11-28 ENCOUNTER — Emergency Department (HOSPITAL_COMMUNITY)
Admission: EM | Admit: 2012-11-28 | Discharge: 2012-11-28 | Disposition: A | Discharge status: Home / Self Care | Payer: BC Managed Care – PPO | Attending: Emergency Medicine | Admitting: Emergency Medicine

## 2012-11-28 ENCOUNTER — Encounter (HOSPITAL_COMMUNITY): Payer: Self-pay | Admitting: Emergency Medicine

## 2012-11-28 DIAGNOSIS — Z8659 Personal history of other mental and behavioral disorders: Secondary | ICD-10-CM

## 2012-11-28 DIAGNOSIS — Z862 Personal history of diseases of the blood and blood-forming organs and certain disorders involving the immune mechanism: Secondary | ICD-10-CM | POA: Insufficient documentation

## 2012-11-28 DIAGNOSIS — Z6379 Other stressful life events affecting family and household: Secondary | ICD-10-CM

## 2012-11-28 DIAGNOSIS — Z79899 Other long term (current) drug therapy: Secondary | ICD-10-CM | POA: Insufficient documentation

## 2012-11-28 DIAGNOSIS — Z86711 Personal history of pulmonary embolism: Secondary | ICD-10-CM | POA: Insufficient documentation

## 2012-11-28 DIAGNOSIS — F17200 Nicotine dependence, unspecified, uncomplicated: Secondary | ICD-10-CM | POA: Insufficient documentation

## 2012-11-28 DIAGNOSIS — F411 Generalized anxiety disorder: Secondary | ICD-10-CM | POA: Insufficient documentation

## 2012-11-28 DIAGNOSIS — F419 Anxiety disorder, unspecified: Secondary | ICD-10-CM

## 2012-11-28 DIAGNOSIS — Z8547 Personal history of malignant neoplasm of testis: Secondary | ICD-10-CM | POA: Insufficient documentation

## 2012-11-28 DIAGNOSIS — F430 Acute stress reaction: Secondary | ICD-10-CM | POA: Insufficient documentation

## 2012-11-28 DIAGNOSIS — Z8719 Personal history of other diseases of the digestive system: Secondary | ICD-10-CM | POA: Insufficient documentation

## 2012-11-28 MED ORDER — LORAZEPAM 1 MG PO TABS: 1.0000 mg | ORAL_TABLET | Freq: Three times a day (TID) | ORAL | Status: AC | PRN

## 2012-11-28 NOTE — ED Notes (Signed)
The pt feels sad about family situations
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
## 2012-12-05 NOTE — ED Provider Notes (Signed)
Medical screening examination/treatment/procedure(s) were performed by non-physician practitioner and as supervising physician I was immediately available for consultation/collaboration.  EKG Interpretation   None        Hinton Luellen F Ledger Heindl, MD 12/05/12 1626 

## 2013-03-25 ENCOUNTER — Encounter (INDEPENDENT_AMBULATORY_CARE_PROVIDER_SITE_OTHER): Payer: Self-pay

## 2013-04-29 ENCOUNTER — Ambulatory Visit (INDEPENDENT_AMBULATORY_CARE_PROVIDER_SITE_OTHER): Payer: BC Managed Care – PPO | Admitting: Internal Medicine

## 2013-04-29 ENCOUNTER — Encounter: Payer: Self-pay | Admitting: Internal Medicine

## 2013-04-29 VITALS — BP 140/90 | HR 91 | Ht 67.0 in | Wt 253.1 lb

## 2013-04-29 DIAGNOSIS — R0602 Shortness of breath: Secondary | ICD-10-CM

## 2013-04-29 DIAGNOSIS — R079 Chest pain, unspecified: Secondary | ICD-10-CM | POA: Insufficient documentation

## 2013-04-29 DIAGNOSIS — R0989 Other specified symptoms and signs involving the circulatory and respiratory systems: Secondary | ICD-10-CM

## 2013-04-29 DIAGNOSIS — R9431 Abnormal electrocardiogram [ECG] [EKG]: Secondary | ICD-10-CM | POA: Insufficient documentation

## 2013-04-29 DIAGNOSIS — R0609 Other forms of dyspnea: Secondary | ICD-10-CM

## 2013-04-29 DIAGNOSIS — Z8249 Family history of ischemic heart disease and other diseases of the circulatory system: Secondary | ICD-10-CM

## 2013-04-29 DIAGNOSIS — G4710 Hypersomnia, unspecified: Secondary | ICD-10-CM

## 2013-04-29 DIAGNOSIS — E669 Obesity, unspecified: Secondary | ICD-10-CM

## 2013-04-29 DIAGNOSIS — G4730 Sleep apnea, unspecified: Secondary | ICD-10-CM

## 2013-04-29 DIAGNOSIS — I10 Essential (primary) hypertension: Secondary | ICD-10-CM

## 2013-04-29 NOTE — Progress Notes (Signed)
OFFICE NOTE  Chief Complaint:  Chest pain, increasing dyspnea  Primary Care Physician: Mike Penna., MD  HPI:  Mike Webb is a pleasant 43 year old male who is near morbidly obese. He also has a history of hypertension, family history of premature coronary disease and prior testicular cancer. He is also been a long-time smoker. He has recently been taking Chantix and is working on smoking cessation. He is also on a number of different medications for anxiety and insomnia as well as probably attention deficit disorder. He reports that he was recently having sex with his girlfriend and reported tightness in his chest and worsening shortness of breath that caused him to stop. The symptoms did not resolve quickly. This was unusual and a new finding for him. EKG is performed and his primary care provider's office demonstrated lateral T wave inversions which are persistent today. He has done less activity recently but notes that he has become more short of breath doing activities. He's also had recent weight gain. He had a chest x-ray performed which was negative for acute pulmonary process. He is also reported palpitations. Finally, he reports being told that he snores, has been witnessed by previous girlfriends to have apneic episodes, has nonrestorative sleep, reports fatigue, and may very well have sleep apnea. His Epworth sleepiness scale was 13.  PMHx:  Past Medical History  Diagnosis Date  . Neck mass   . Clotting disorder   . Sickle cell trait   . Pulmonary embolism   . Cancer   . GERD (gastroesophageal reflux disease)     takes protonix    Past Surgical History  Procedure Laterality Date  . Surgery scrotal / testicular    . Cholecystectomy  02/21/2012    Procedure: LAPAROSCOPIC CHOLECYSTECTOMY WITH INTRAOPERATIVE CHOLANGIOGRAM;  Surgeon: Mike Hook, DO;  Location: [facility] OR;  Service: General;  Laterality: N/A;    FAMHx:  Family History  Problem Relation Age of Onset    . Hypertension Mother   . Heart attack Father   . Stroke Father   . Hypertension Father   . Hypertension Sister   . Hypertension Brother     SOCHx:   reports that he quit smoking about 4 weeks ago. He has never used smokeless tobacco. He reports that he drinks about 0.5 ounces of alcohol per week. He reports that he uses illicit drugs (Marijuana).  ALLERGIES:  No Known Allergies  ROS: A comprehensive review of systems was negative except for: Respiratory: positive for dyspnea on exertion Cardiovascular: positive for exertional chest pressure/discomfort  HOME MEDS: Current Outpatient Prescriptions  Medication Sig Dispense Refill  . amoxicillin (AMOXIL) 500 MG capsule as directed.      . ANDROGEL PUMP 20.25 MG/ACT (1.62%) GEL daily.      . ANUCORT-HC 25 MG suppository as needed.      Marland Kitchen atomoxetine (STRATTERA) 100 MG capsule Take 100 mg by mouth daily.      Marland Kitchen CIALIS 20 MG tablet as needed.      Marland Kitchen lisinopril (PRINIVIL,ZESTRIL) 40 MG tablet Take 40 mg by mouth daily.      Marland Kitchen LORazepam (ATIVAN) 1 MG tablet Take 1 tablet (1 mg total) by mouth every 8 (eight) hours as needed for anxiety or sleep.  15 tablet  0  . Multiple Vitamin (MULTIVITAMIN WITH MINERALS) TABS Take 1 tablet by mouth daily.      . sertraline (ZOLOFT) 50 MG tablet Take 1 tablet by mouth daily.      Marland Kitchen  Vitamin D, Ergocalciferol, (DRISDOL) 50000 UNITS CAPS capsule Take 50,000 Units by mouth every 7 (seven) days. Takes on wednesday      . zolpidem (AMBIEN) 10 MG tablet Take 10 mg by mouth at bedtime as needed. For sleep       No current facility-administered medications for this visit.    LABS/IMAGING: No results found for this or any previous visit (from the past 48 hour(s)). No results found.  VITALS: BP 140/90  Pulse 91  Ht 5\' 7"  (1.702 m)  Wt 253 lb 1.6 oz (114.805 kg)  BMI 39.63 kg/m2  EXAM: General appearance: alert and no distress Neck: no carotid bruit and no JVD Lungs: clear to auscultation  bilaterally Heart: regular rate and rhythm, S1, S2 normal, no murmur, click, rub or gallop Abdomen: soft, non-tender; bowel sounds normal; no masses,  no organomegaly and morbidly obese Extremities: extremities normal, atraumatic, no cyanosis or edema Pulses: 2+ and symmetric Skin: Skin color, texture, turgor normal. No rashes or lesions Neurologic: Grossly normal Psych: Anxiosu  EKG: Normal sinus rhythm at 91, lateral T-wave inversions concerning for ischemia  ASSESSMENT: 1. Dyspnea on exertion/chest pressure 2. Abnormal EKG concerning for ischemia 3. Ongoing tobacco abuse 4. Hypertension 5. Near morbid obesity 6. Strong family history of coronary disease 7. Snoring/non-restorative sleep/fatigue-concerning for sleep apnea  PLAN: 1.   [name] had exertional dyspnea and chest pressure which is concerning for ischemia. His EKG is abnormal and I would recommend an exercise nuclear stress test. If this is negative, his symptoms may be due to weight gain. Mike Webb reports a history of snoring, nonrestorative sleep and fatigue which is concerning for sleep apnea. I would recommend a split-night sleep study. Plan to see him back for followup of these tests in a few weeks.  Thanks again for the kind referral.  Mike Casino, MD, [facility] Attending Cardiologist [facility]  Webb,Mike C 04/29/2013, 8:24 PM

## 2013-04-29 NOTE — Patient Instructions (Signed)
Your physician has recommended that you have a sleep study. This test records several body functions during sleep, including: brain activity, eye movement, oxygen and carbon dioxide blood levels, heart rate and rhythm, breathing rate and rhythm, the flow of air through your mouth and nose, snoring, body muscle movements, and chest and belly movement.  Your physician has requested that you have en exercise stress myoview. For further information please visit [email or web address]. Please follow instruction sheet, as given.  Your physician recommends that you schedule a follow-up appointment in: 1 month - after your tests.

## 2013-04-30 ENCOUNTER — Telehealth (HOSPITAL_COMMUNITY): Payer: Self-pay

## 2013-05-06 ENCOUNTER — Ambulatory Visit (HOSPITAL_COMMUNITY)
Admission: RE | Admit: 2013-05-06 | Discharge: 2013-05-06 | Disposition: A | Discharge status: Home / Self Care | Payer: BC Managed Care – PPO | Source: Ambulatory Visit | Attending: Cardiovascular Disease | Admitting: Cardiovascular Disease

## 2013-05-06 DIAGNOSIS — R0602 Shortness of breath: Secondary | ICD-10-CM

## 2013-05-06 DIAGNOSIS — Z8249 Family history of ischemic heart disease and other diseases of the circulatory system: Secondary | ICD-10-CM | POA: Insufficient documentation

## 2013-05-06 DIAGNOSIS — R079 Chest pain, unspecified: Secondary | ICD-10-CM

## 2013-05-06 DIAGNOSIS — I10 Essential (primary) hypertension: Secondary | ICD-10-CM | POA: Insufficient documentation

## 2013-05-06 DIAGNOSIS — R9431 Abnormal electrocardiogram [ECG] [EKG]: Secondary | ICD-10-CM

## 2013-05-06 MED ORDER — TECHNETIUM TC 99M SESTAMIBI GENERIC - CARDIOLITE
29.2000 | Freq: Once | INTRAVENOUS | Status: AC | PRN
  Administered 2013-05-06: 29.2 via INTRAVENOUS

## 2013-05-06 MED ORDER — TECHNETIUM TC 99M SESTAMIBI GENERIC - CARDIOLITE
10.2000 | Freq: Once | INTRAVENOUS | Status: AC | PRN
  Administered 2013-05-06: 10 via INTRAVENOUS

## 2013-05-06 NOTE — Procedures (Addendum)
[place] [address] [place] [address] [phone]  Cardiology Nuclear Med Study  Mike Webb is a 43 y.o. male     MRN : [id number]     DOB: [birth year]  Procedure Date: 05/06/2013  Nuclear Med Background Indication for Stress Test:  Evaluation for Ischemia and Abnormal EKG History:  No prior cardiac or respiratory disease reported. Pt does have a hx of clotting disorder;No prior NUC study for comparrison. Cardiac Risk Factors: Family History - CAD, History of Smoking, Hypertension, Obesity and PE  Symptoms:  Chest Pain, DOE, Fatigue, Palpitations and SOB   Nuclear Pre-Procedure Caffeine/Decaff Intake:  7:00pm NPO After: 5:00am   IV Site: R Forearm  IV 0.9% NS with Angio Cath:  22g  Chest Size (in):  46"  IV Started by: Rolene Course, RN  Height: 5\' 7"  (1.702 m)  Cup Size: n/a  BMI:  Body mass index is 39.62 kg/(m^2). Weight:  253 lb (114.76 kg)   Tech Comments:  n/a    Nuclear Med Study 1 or 2 day study: 1 day  Stress Test Type:  Stress  Order Authorizing Provider:  Lyman Bishop, MD   Resting Radionuclide: Technetium 70m Sestamibi  Resting Radionuclide Dose: 10.2 mCi   Stress Radionuclide:  Technetium 77m Sestamibi  Stress Radionuclide Dose: 29.2 mCi           Stress Protocol Rest HR: 76 Stress HR: 179  Rest BP: 134/85 Stress BP: 157/96  Exercise Time (min): 9 METS: 10.1   Predicted Max HR: 180 bpm % Max HR: 99.44 bpm Rate Pressure Product: 33652  Dose of Adenosine (mg):  n/a Dose of Lexiscan: n/a mg  Dose of Atropine (mg): n/a Dose of Dobutamine: n/a mcg/kg/min (at max HR)  Stress Test Technologist: Leane Para, CCT Nuclear Technologist: Imagene Riches, CNMT   Rest Procedure:  Myocardial perfusion imaging was performed at rest 45 minutes following the intravenous administration of Technetium 63m Sestamibi. Stress Procedure:  The patient performed treadmill exercise using a Bruce  Protocol for  9 minutes. The patient stopped due to SOB and leg fatigue and denied any chest pain.  There were no significant ST-T wave changes.  Technetium 45m Sestamibi was injected at peak exercise and myocardial perfusion imaging was performed after a brief delay.  Transient Ischemic Dilatation (Normal <1.22):  1.03 Lung/Heart Ratio (Normal <0.45):  0.27 QGS EDV:  108 ml QGS ESV:  43 ml LV Ejection Fraction: 60%        Rest ECG: NSR - Normal EKG  Stress ECG: No significant change from baseline ECG  QPS Raw Data Images:  Normal; no motion artifact; normal heart/lung ratio. Stress Images:  Normal homogeneous uptake in all areas of the myocardium. Rest Images:  Normal homogeneous uptake in all areas of the myocardium. Subtraction (SDS):  No evidence of ischemia.  Impression Exercise Capacity:  Good exercise capacity. BP Response:  Normal blood pressure response. Clinical Symptoms:  No symptoms. ECG Impression:  No significant ST segment change suggestive of ischemia. Comparison with Prior Nuclear Study: No images to compare  Overall Impression:  Normal stress nuclear study.  LV Wall Motion:  NL LV Function; NL Wall Motion   Lorretta Harp, MD  05/06/2013 1:41 PM

## 2013-06-09 ENCOUNTER — Encounter: Payer: Self-pay | Admitting: Internal Medicine

## 2013-06-09 ENCOUNTER — Ambulatory Visit (INDEPENDENT_AMBULATORY_CARE_PROVIDER_SITE_OTHER): Payer: BC Managed Care – PPO | Admitting: Internal Medicine

## 2013-06-09 VITALS — BP 135/93 | HR 85 | Ht 67.0 in | Wt 261.9 lb

## 2013-06-09 DIAGNOSIS — R079 Chest pain, unspecified: Secondary | ICD-10-CM

## 2013-06-09 DIAGNOSIS — Z8249 Family history of ischemic heart disease and other diseases of the circulatory system: Secondary | ICD-10-CM

## 2013-06-09 DIAGNOSIS — E669 Obesity, unspecified: Secondary | ICD-10-CM

## 2013-06-09 MED ORDER — ESOMEPRAZOLE MAGNESIUM 40 MG PO CPDR: 40.0000 mg | DELAYED_RELEASE_CAPSULE | Freq: Every day | ORAL | Status: DC

## 2013-06-09 NOTE — Progress Notes (Signed)
OFFICE NOTE  Chief Complaint:  Chest pain, increasing dyspnea  Primary Care Physician: Vickii Penna., MD  HPI:  Mike Webb is a pleasant 43 year old male who is near morbidly obese. He also has a history of hypertension, family history of premature coronary disease and prior testicular cancer. He is also been a long-time smoker. He has recently been taking Chantix and is working on smoking cessation. He is also on a number of different medications for anxiety and insomnia as well as probably attention deficit disorder. He reports that he was recently having sex with his girlfriend and reported tightness in his chest and worsening shortness of breath that caused him to stop. The symptoms did not resolve quickly. This was unusual and a new finding for him. EKG is performed and his primary care provider's office demonstrated lateral T wave inversions which are persistent today. He has done less activity recently but notes that he has become more short of breath doing activities. He's also had recent weight gain. He had a chest x-ray performed which was negative for acute pulmonary process. He is also reported palpitations. Finally, he reports being told that he snores, has been witnessed by previous girlfriends to have apneic episodes, has nonrestorative sleep, reports fatigue, and may very well have sleep apnea. His Epworth sleepiness scale was 13.  Mr. Linward Foster returns today for followup of a stress test. He exercised for 9 minutes and 10 metabolic equivalents. There were no significant EKG changes. Perfusion images were normal. His chest pain has improved. He complains of some reflux symptoms however which occur on a daily basis. He notes that his cough he tends to worsen it somewhat. Also worse after eating at night when laying down. He's been using TUMS for treatment.  PMHx:  Past Medical History  Diagnosis Date  . Neck mass   . Clotting disorder   . Sickle cell trait   . Pulmonary  embolism   . Cancer   . GERD (gastroesophageal reflux disease)     takes protonix    Past Surgical History  Procedure Laterality Date  . Surgery scrotal / testicular    . Cholecystectomy  02/21/2012    Procedure: LAPAROSCOPIC CHOLECYSTECTOMY WITH INTRAOPERATIVE CHOLANGIOGRAM;  Surgeon: Madilyn Hook, DO;  Location: [facility] OR;  Service: General;  Laterality: N/A;    FAMHx:  Family History  Problem Relation Age of Onset  . Hypertension Mother   . Heart attack Father   . Stroke Father   . Hypertension Father   . Hypertension Sister   . Hypertension Brother     SOCHx:   reports that he quit smoking about 2 months ago. He has never used smokeless tobacco. He reports that he drinks about .5 ounces of alcohol per week. He reports that he uses illicit drugs (Marijuana).  ALLERGIES:  No Known Allergies  ROS: A comprehensive review of systems was negative except for: Respiratory: positive for dyspnea on exertion Cardiovascular: positive for exertional chest pressure/discomfort  HOME MEDS: Current Outpatient Prescriptions  Medication Sig Dispense Refill  . amoxicillin (AMOXIL) 500 MG capsule as directed.      . ANDROGEL PUMP 20.25 MG/ACT (1.62%) GEL daily.      . ANUCORT-HC 25 MG suppository as needed.      Marland Kitchen atomoxetine (STRATTERA) 100 MG capsule Take 100 mg by mouth daily.      Marland Kitchen CIALIS 20 MG tablet as needed.      Marland Kitchen lisinopril (PRINIVIL,ZESTRIL) 40 MG tablet Take 40 mg by  mouth daily.      Marland Kitchen LORazepam (ATIVAN) 1 MG tablet Take 1 tablet (1 mg total) by mouth every 8 (eight) hours as needed for anxiety or sleep.  15 tablet  0  . Multiple Vitamin (MULTIVITAMIN WITH MINERALS) TABS Take 1 tablet by mouth daily.      . sertraline (ZOLOFT) 50 MG tablet Take 1 tablet by mouth daily.      . Vitamin D, Ergocalciferol, (DRISDOL) 50000 UNITS CAPS capsule Take 50,000 Units by mouth every 7 (seven) days. Takes on wednesday      . zolpidem (AMBIEN) 10 MG tablet Take 10 mg by mouth at bedtime as  needed. For sleep      . esomeprazole (NEXIUM) 40 MG capsule Take 1 capsule (40 mg total) by mouth daily at 12 noon.  30 capsule  11   No current facility-administered medications for this visit.    LABS/IMAGING: No results found for this or any previous visit (from the past 48 hour(s)). No results found.  VITALS: BP 135/93  Pulse 85  Ht 5\' 7"  (1.702 m)  Wt 261 lb 14.4 oz (118.797 kg)  BMI 41.01 kg/m2  EXAM: deferred  EKG: deferred  ASSESSMENT: 1. GERD 2. Abnormal EKG - negative nuclear stress 3. Ongoing tobacco abuse 4. Hypertension 5. Near morbid obesity 6. Strong family history of coronary disease 7. Snoring/non-restorative sleep/fatigue-concerning for sleep apnea  PLAN: 1.   Mr. Capo had a negative exercise nuclear stress test which is reassuring. I've encouraged her to start an exercise program which should help with weight loss as well as changing his eating habits. He is continuing to work on smoking cessation and has cut back significantly. He continues to have reflux symptoms and I've written him for Nexium 40 mg daily today. He is scheduled for a sleep apnea study and I will followup on those results. Otherwise he can followup on an as-needed basis with our office.  Thanks again for the kind referral.  Pixie Casino, MD, [facility] Attending Cardiologist [place] 06/09/2013, 10:39 AM

## 2013-06-09 NOTE — Patient Instructions (Addendum)
**  Please schedule your sleep study**  Your physician recommends that you schedule a follow-up appointment as needed.  Dr. Debara Pickett has prescribed nexium 40mg  - take once daily.

## 2013-07-03 IMAGING — CT CT NECK W/ CM
4 of 6 series · 15 of 33 positions shown, 17 images · IV contrast (75CC OMNI 300)
Comparison: None.

CLINICAL DATA: 40-year-old male with swelling and mass at the left
neck.

CT NECK WITH CONTRAST
TECHNIQUE: Multidetector CT imaging of the neck was performed with
intravenous contrast.
Contrast: 75mL OMNIPAQUE IOHEXOL 300 MG/ML  SOLN

[Series 2: axial neck · axial · 0.47mm/px · z∈[+82,+202]mm · 3 of 97 slices shown]
[im 25/97  bone]
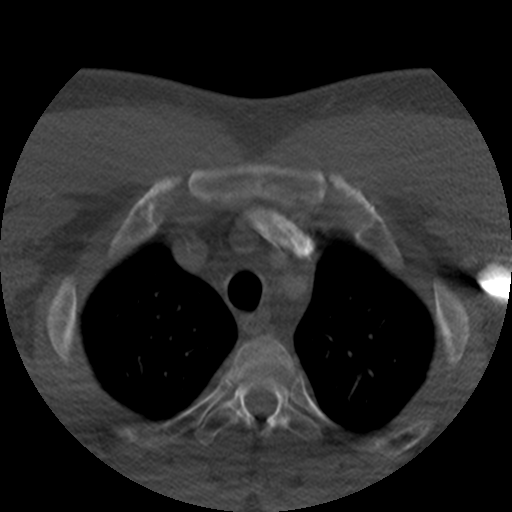
[im 49/97  bone]
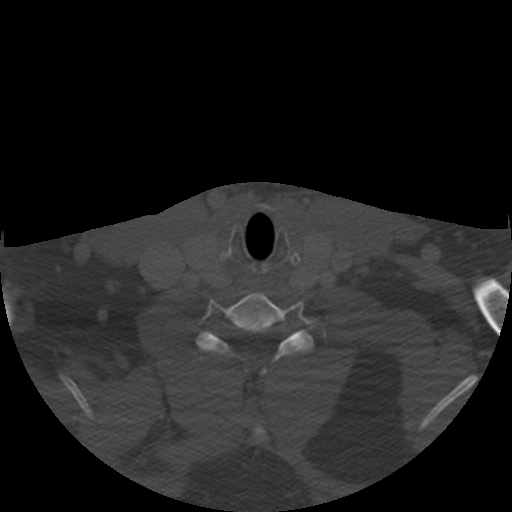
[im 73/97  bone]
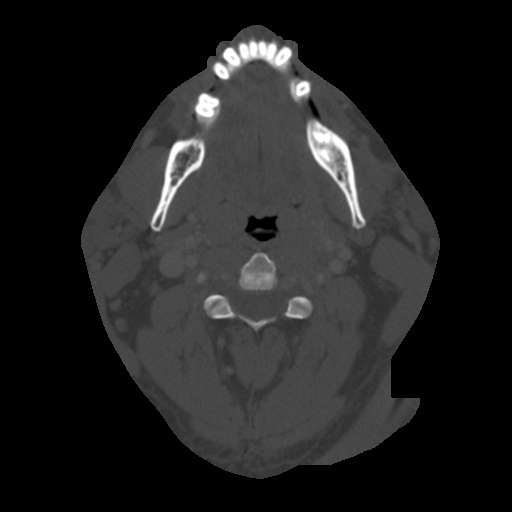

[Series 400: cor · coronal · 0.48mm/px · 3 of 90 slices shown]
[im 18/90  bone]
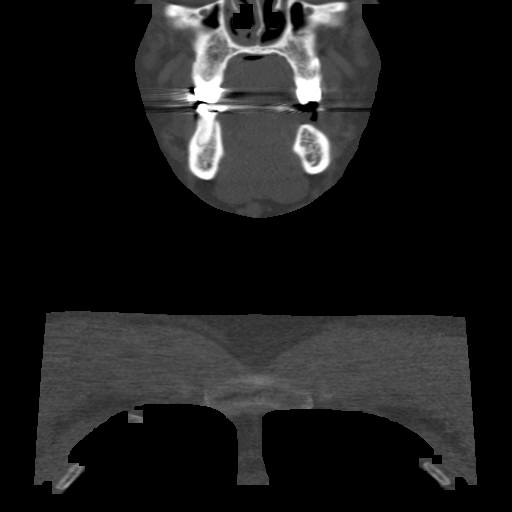
[im 36/90  bone]
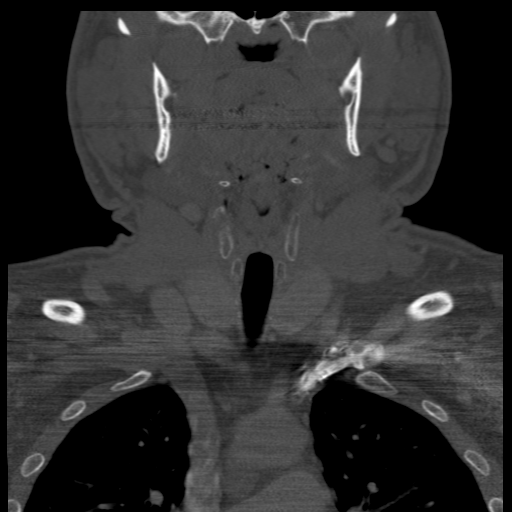
[im 54/90  bone]
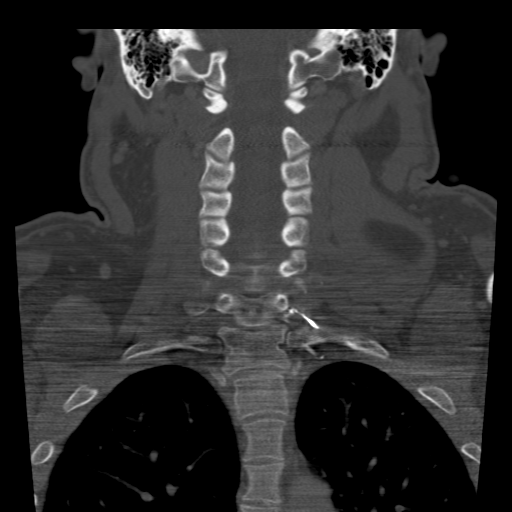

[Series 401: sag · sagittal · 0.48mm/px · 5 of 95 slices shown, 6 images]
[im 32/95  bone]
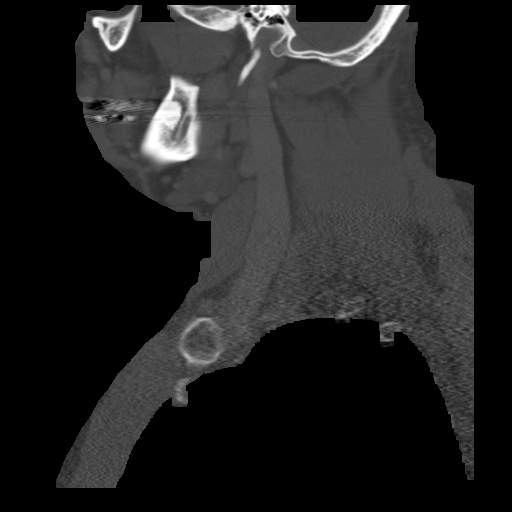
[im 40/95  bone]
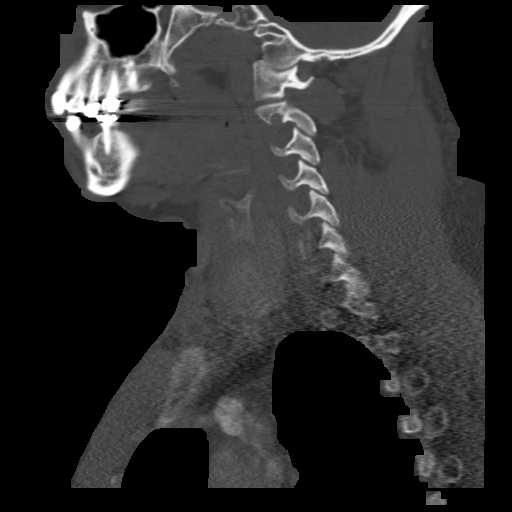
[im 48/95  soft-tissue]
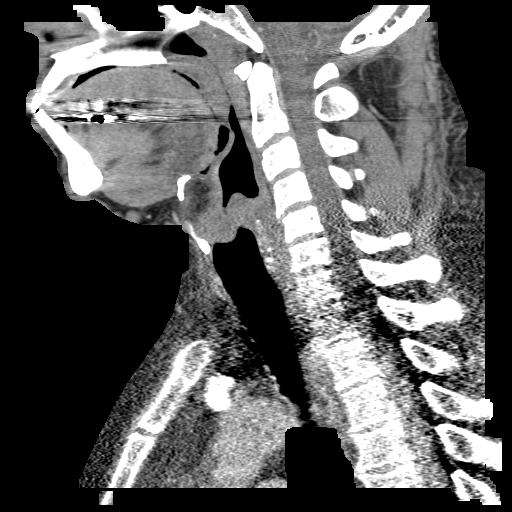
[im 48/95  bone]
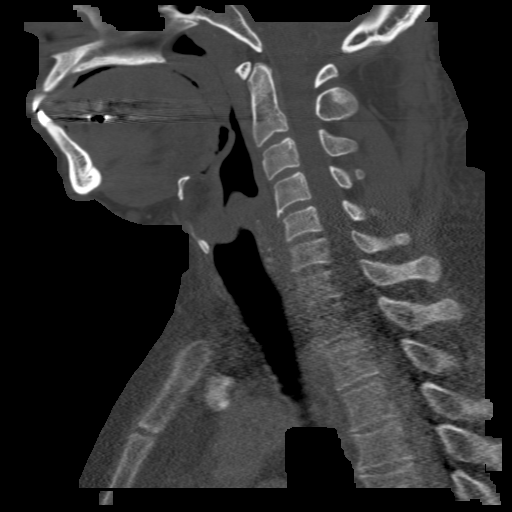
[im 55/95  bone]
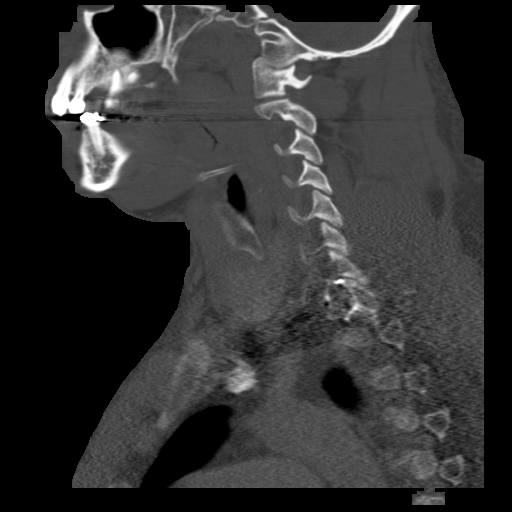
[im 63/95  bone]
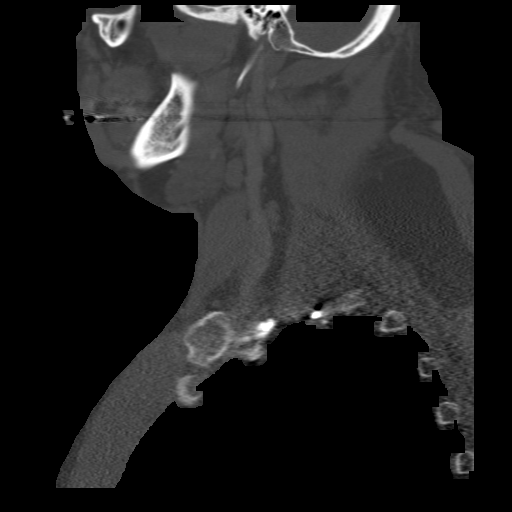

[Series 402: angled axial · axial · 0.48mm/px · z∈[+43,+187]mm · 4 of 100 slices shown, 5 images]
[im 20/100  soft-tissue]
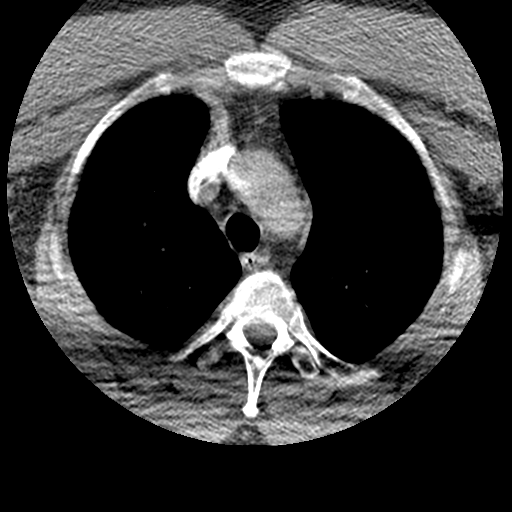
[im 20/100  bone]
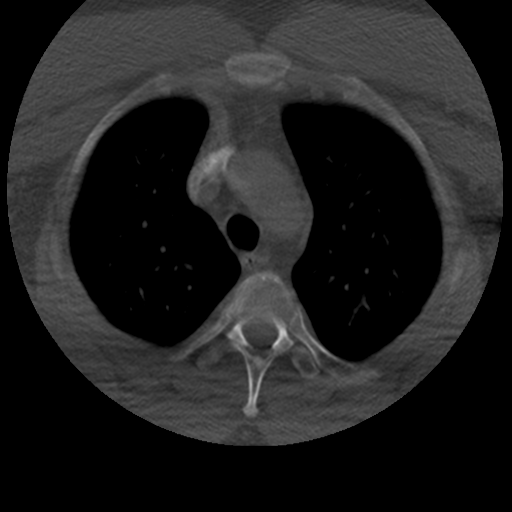
[im 40/100  bone]
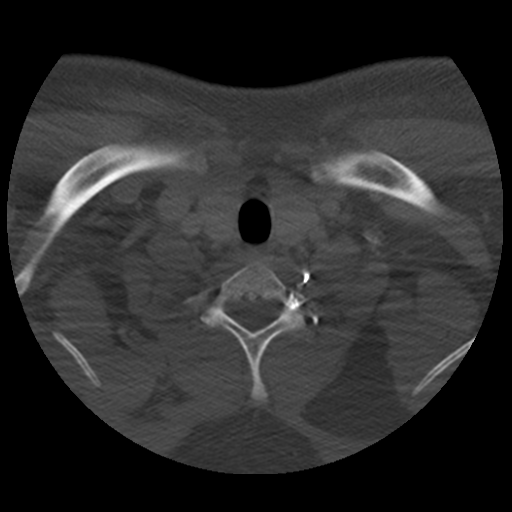
[im 60/100  bone]
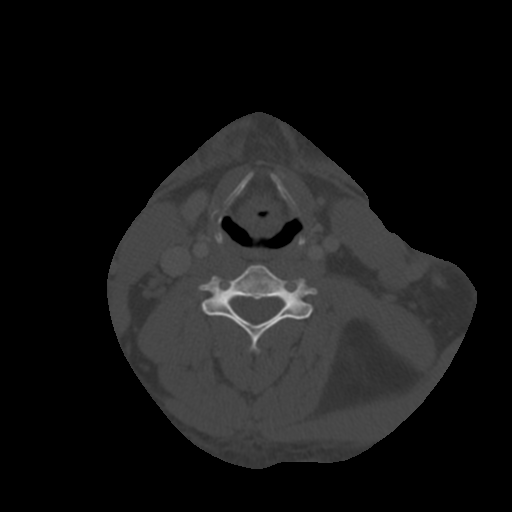
[im 80/100  bone]
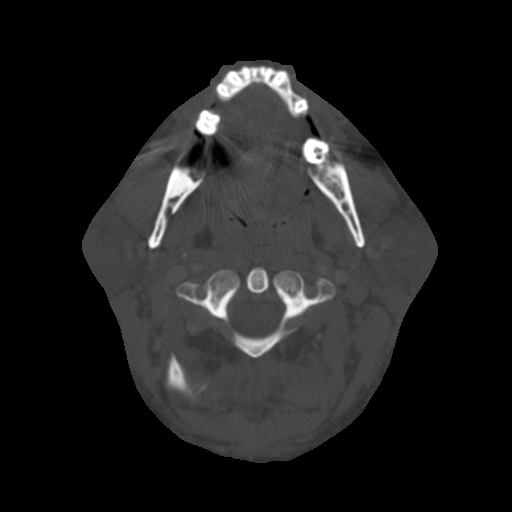

[15 of 33 positions shown; findings below may reference images not displayed]

FINDINGS: The area of clinical concern is marked on the left
lateral neck at the level of the angle of the mandible, series 2
image 29.  The marker is placed along the posterior left
sternocleidomastoid muscle which appears within normal limits.
Mild skin thickening in the region appears related to body habitus.

Extending caudal from the level of the marker is a fairly large
teardrop shaped fat density lesion compatible with a simple lipoma.
This encompasses 62 x 57 x 74 mm (AP by transverse by CC) and is
situated between the left deep paraspinal and left trapezius
musculature.  There are several fine septations along the margins
of the lesion, and a small vessel along its lateral margin, but no
suspicious internal architecture or nodularity. No stranding or
surrounding inflammatory change. The lesion terminates at the level
of the thoracic inlet posteriorly.

No mass effect on the carotid space.  Minimal anterior mass effect
on the caudal aspect of the sternocleidomastoid.

Negative superior mediastinum, thyroid, larynx, pharynx,
parapharyngeal spaces, retropharyngeal space, sublingual space,
submandibular glands and parotid glands.  Scattered small cervical
lymph nodes are not enlarged by CT criteria and are fairly
symmetric.  Major vascular structures in the neck are grossly
patent.

Visualized paranasal sinuses and mastoids are clear.  No acute
osseous abnormality identified.  Negative lung apices.
IMPRESSION: 1.  Left side palpable abnormality corresponds to a large simple
appearing lipoma, 62 x 57 x 74 mm, situated between the left deep
paraspinal and trapezius musculature.
2.  No other acute findings in the neck.

## 2013-08-06 NOTE — Telephone Encounter (Signed)
Encounter complete. 

## 2013-08-14 ENCOUNTER — Other Ambulatory Visit: Payer: Self-pay | Admitting: *Deleted

## 2013-08-14 DIAGNOSIS — R0989 Other specified symptoms and signs involving the circulatory and respiratory systems: Secondary | ICD-10-CM

## 2013-08-14 DIAGNOSIS — G4730 Sleep apnea, unspecified: Secondary | ICD-10-CM

## 2013-08-14 DIAGNOSIS — G4710 Hypersomnia, unspecified: Secondary | ICD-10-CM

## 2013-08-14 DIAGNOSIS — R0609 Other forms of dyspnea: Secondary | ICD-10-CM

## 2013-10-19 ENCOUNTER — Ambulatory Visit (HOSPITAL_BASED_OUTPATIENT_CLINIC_OR_DEPARTMENT_OTHER): Payer: BC Managed Care – PPO | Attending: Internal Medicine

## 2013-10-19 VITALS — Ht 67.0 in | Wt 260.0 lb

## 2013-10-19 DIAGNOSIS — G4733 Obstructive sleep apnea (adult) (pediatric): Secondary | ICD-10-CM | POA: Insufficient documentation

## 2013-10-19 DIAGNOSIS — R0989 Other specified symptoms and signs involving the circulatory and respiratory systems: Secondary | ICD-10-CM

## 2013-10-19 DIAGNOSIS — G4710 Hypersomnia, unspecified: Secondary | ICD-10-CM

## 2013-10-19 DIAGNOSIS — G4730 Sleep apnea, unspecified: Secondary | ICD-10-CM

## 2013-10-19 DIAGNOSIS — R0609 Other forms of dyspnea: Secondary | ICD-10-CM

## 2013-10-29 DIAGNOSIS — G4733 Obstructive sleep apnea (adult) (pediatric): Secondary | ICD-10-CM

## 2013-10-29 NOTE — Sleep Study (Signed)
   NAME: Mike Webb DATE OF BIRTH:  [birth year] MEDICAL RECORD NUMBER [id number]  LOCATION: [facility]  PHYSICIAN: Kathee Delton  DATE OF STUDY: 10/19/2013  SLEEP STUDY TYPE: Nocturnal Polysomnogram               REFERRING PHYSICIAN: Pixie Casino., MD  INDICATION FOR STUDY: Hypersomnia with sleep apnea  EPWORTH SLEEPINESS SCORE:  19 HEIGHT: 5\' 7"  (170.2 cm)  WEIGHT: 260 lb (117.935 kg)    Body mass index is 40.71 kg/(m^2).  NECK SIZE: 18 in.  MEDICATIONS: Reviewed in the sleep record  SLEEP ARCHITECTURE: The patient had a total sleep time of 340 minutes with no slow-wave sleep and only 40 minutes of REM. Sleep onset latency was normal at 8 minutes, and REM onset was prolonged at 238 minutes. Sleep efficiency was mildly reduced at 88%.  RESPIRATORY DATA: The patient was found to have 34 apneas and 46 obstructive hypopneas, giving him an AHI of 14 events per hour. The events occurred in all body positions, and there was very loud snoring noted throughout. The patient did not meet split-night protocol secondary to the majority of his events occurring well after 1 AM.  OXYGEN DATA: There was oxygen desaturation as low as 80% with the patient's obstructive events  CARDIAC DATA: No clinically significant arrhythmias were noted  MOVEMENT/PARASOMNIA: No periodic limb movements or other abnormal behaviors were seen.  IMPRESSION/ RECOMMENDATION:    1) mild to moderate obstructive sleep apnea/hypopnea syndrome, with an AHI of 14 events per hour and oxygen desaturation as low as 80%. Treatment for this degree of sleep apnea can include a trial of weight loss alone, upper airway surgery, dental appliance, and also CPAP. Clinical correlation is suggested.     [place], American Board of Sleep Medicine  ELECTRONICALLY SIGNED ON:  10/29/2013, 5:32 PM [place] PH: [phone]   FX: [phone] [place]

## 2013-11-13 ENCOUNTER — Telehealth: Payer: Self-pay | Admitting: *Deleted

## 2013-11-13 ENCOUNTER — Encounter: Payer: Self-pay | Admitting: *Deleted

## 2013-11-13 DIAGNOSIS — G4733 Obstructive sleep apnea (adult) (pediatric): Secondary | ICD-10-CM

## 2013-11-13 NOTE — Telephone Encounter (Signed)
Provided patient with sleep study results - mild to moderate OSA. (consulted with Dr. Claiborne Billings who recommended CPAP titration based on results) Patient would like to set up titration. Will order procedure.  Patient has appmt 11/16 with Dr. Debara Pickett

## 2013-11-13 NOTE — Progress Notes (Signed)
This encounter was created in error - please disregard.

## 2013-11-18 ENCOUNTER — Ambulatory Visit (HOSPITAL_BASED_OUTPATIENT_CLINIC_OR_DEPARTMENT_OTHER): Payer: BC Managed Care – PPO | Attending: Internal Medicine

## 2013-11-18 VITALS — Ht 67.0 in | Wt 262.0 lb

## 2013-11-18 DIAGNOSIS — G4733 Obstructive sleep apnea (adult) (pediatric): Secondary | ICD-10-CM | POA: Insufficient documentation

## 2013-11-18 DIAGNOSIS — Z9989 Dependence on other enabling machines and devices: Secondary | ICD-10-CM

## 2013-11-23 DIAGNOSIS — G4733 Obstructive sleep apnea (adult) (pediatric): Secondary | ICD-10-CM

## 2013-11-23 NOTE — Sleep Study (Signed)
   NAME: Mike Webb DATE OF BIRTH:  [birth year] MEDICAL RECORD NUMBER [id number]  LOCATION: [facility]  PHYSICIAN: KELLY,THOMAS A  DATE OF STUDY: 11/18/2013  SLEEP STUDY TYPE: Positive Airway Pressure Titration               REFERRING PHYSICIAN: Pixie Casino., MD  INDICATION FOR STUDY: [name] is a 43 year old African-American male who was diagnosed with mild to moderate obstructive sleep apnea on diagnostic polysomnogram.  AHI was 14 per hour and he had oxygen desaturation to 80%.  He now presents for CPAP titration trial.  EPWORTH SLEEPINESS SCORE:   HEIGHT: 5\' 7"  (170.2 cm)  WEIGHT: 262 lb (118.842 kg)    Body mass index is 41.03 kg/(m^2).  NECK SIZE: 19 in.  MEDICATIONS:  zolpidem (AMBIEN) 10 MG tablet 10 mg, At bedtime PRN Vitamin D, Ergocalciferol, (DRISDOL) 50000 UNITS CAPS capsule 50,000 Units, Every 7 days sertraline (ZOLOFT) 50 MG tablet 1 tablet, Daily  Note: Received from: External Pharmacy Received Sig: (Written 04/29/2013 1012)  Multiple Vitamin (MULTIVITAMIN WITH MINERALS) TABS 1 tablet, Daily LORazepam (ATIVAN) 1 MG tablet 1 mg, Every 8 hours PRN lisinopril (PRINIVIL,ZESTRIL) 40 MG tablet 40 mg, Daily esomeprazole (NEXIUM) 40 MG capsule 40 mg, Daily CIALIS 20 MG tablet As needed  Note: Received from: External Pharmacy Received Sig: (Written 04/29/2013 1012)  atomoxetine (STRATTERA) 100 MG capsule 100 mg, Daily ANUCORT-HC 25 MG suppository As needed  Note: Received from: External Pharmacy Received Sig: (Written 04/29/2013 1012)  ANDROGEL PUMP 20.25 MG/ACT (1.62%) GEL Daily  Note: Received from: External Pharmacy Received Sig: (Written 04/29/2013 1012)  amoxicillin (AMOXIL) 500 MG capsule As directed       SLEEP ARCHITECTURE: The recording began at 22:50:46, lights on at 5:55:16.  Sleep time was 394 minutes, corresponding to a 92.8% sleep efficiency.  Sleep onset was 0.5 minutes.  The patient spent 46 minutes in stage I (11.7%), 196.5  minutes in stage II (49.9%), 0 minutes in stage III, and 151 minute 0.5 minutes in stage gram (38.5%).  Onset to REM sleep was 107 minutes.  RESPIRATORY DATA: CPAP was initiated at 5 cm and was titrated up to 17 cm water pressure.  The overall AHI was 1.5.  AHI at 17 cm was 0.  There were 4 arousals.  The lowest oxygen saturation at 17 cm was 92%.  There was moderate snoring, which resolved at the final CPAP pressure.  OXYGEN DATA: The mean oxygen saturation was 95.1% during sleep.  Lowest oxygen saturation with NREM sleep was 88% with REM sleep 90%.  CARDIAC DATA: The average heart rate was 84 bpm.  MOVEMENT/PARASOMNIA: There were 0 periodic limb movements.  IMPRESSION/ RECOMMENDATION:  Previously documented mild to moderate obstructive sleep apnea with excellent response to CPAP therapy.  Recommend initial use of CPAP with C-Flex/EPR of 3 at 17 cm water pressure with a heated humidifier.  Recommend download be obtained in 30 days and sleep clinic evaluation.  [place], American Board of Sleep Medicine  ELECTRONICALLY SIGNED ON:  11/23/2013, 4:05 PM [place] PH: [phone]   FX: [phone] [place]

## 2013-12-08 ENCOUNTER — Ambulatory Visit (INDEPENDENT_AMBULATORY_CARE_PROVIDER_SITE_OTHER): Payer: BC Managed Care – PPO | Admitting: Internal Medicine

## 2013-12-08 ENCOUNTER — Encounter: Payer: Self-pay | Admitting: Internal Medicine

## 2013-12-08 VITALS — BP 134/90 | HR 87 | Ht 67.0 in | Wt 269.0 lb

## 2013-12-08 DIAGNOSIS — R9431 Abnormal electrocardiogram [ECG] [EKG]: Secondary | ICD-10-CM

## 2013-12-08 DIAGNOSIS — Z9989 Dependence on other enabling machines and devices: Secondary | ICD-10-CM

## 2013-12-08 DIAGNOSIS — I10 Essential (primary) hypertension: Secondary | ICD-10-CM | POA: Insufficient documentation

## 2013-12-08 DIAGNOSIS — G4733 Obstructive sleep apnea (adult) (pediatric): Secondary | ICD-10-CM

## 2013-12-08 DIAGNOSIS — E669 Obesity, unspecified: Secondary | ICD-10-CM

## 2013-12-08 NOTE — Progress Notes (Signed)
OFFICE NOTE  Chief Complaint:  Chest pain, increasing dyspnea  Primary Care Physician: Vickii Penna., MD  HPI:  Mike Webb is a pleasant 43 year old male who is near morbidly obese. He also has a history of hypertension, family history of premature coronary disease and prior testicular cancer. He is also been a long-time smoker. He has recently been taking Chantix and is working on smoking cessation. He is also on a number of different medications for anxiety and insomnia as well as probably attention deficit disorder. He reports that he was recently having sex with his girlfriend and reported tightness in his chest and worsening shortness of breath that caused him to stop. The symptoms did not resolve quickly. This was unusual and a new finding for him. EKG is performed and his primary care provider's office demonstrated lateral T wave inversions which are persistent today. He has done less activity recently but notes that he has become more short of breath doing activities. He's also had recent weight gain. He had a chest x-ray performed which was negative for acute pulmonary process. He is also reported palpitations. Finally, he reports being told that he snores, has been witnessed by previous girlfriends to have apneic episodes, has nonrestorative sleep, reports fatigue, and may very well have sleep apnea. His Epworth sleepiness scale was 13.  Mike Webb had a recent stress test. He exercised for 9 minutes and 10 metabolic equivalents. There were no significant EKG changes. Perfusion images were normal. His chest pain has improved. He complains of some reflux symptoms however which occur on a daily basis. He notes that his cough he tends to worsen it somewhat. Also worse after eating at night when laying down. He's been using TUMS for treatment.  Mike Webb recently had his sleep study and it was determined that he has obstructive sleep apnea. He was fitted with a CPAP mask and  reported feeling outstanding the next day. Unfortunately has not yet received his home equipment but is eager to use it as his energy level is still quite low. He recently joined a gym, bottle-fed. And is changed his lifestyle including what he eats. He is working on weight loss, and I suspect his energy level will improve using CPAP to allow him to do that exercise.  PMHx:  Past Medical History  Diagnosis Date  . Neck mass   . Clotting disorder   . Sickle cell trait   . Pulmonary embolism   . Cancer   . GERD (gastroesophageal reflux disease)     takes protonix    Past Surgical History  Procedure Laterality Date  . Surgery scrotal / testicular    . Cholecystectomy  02/21/2012    Procedure: LAPAROSCOPIC CHOLECYSTECTOMY WITH INTRAOPERATIVE CHOLANGIOGRAM;  Surgeon: Madilyn Hook, DO;  Location: [facility];  Service: General;  Laterality: N/A;    FAMHx:  Family History  Problem Relation Age of Onset  . Hypertension Mother   . Heart attack Father   . Stroke Father   . Hypertension Father   . Hypertension Sister   . Hypertension Brother     SOCHx:   reports that he quit smoking about 2 months ago. He has never used smokeless tobacco. He reports that he drinks about 0.6 oz of alcohol per week. He reports that he uses illicit drugs (Marijuana).  ALLERGIES:  No Known Allergies  ROS: A comprehensive review of systems was negative.  HOME MEDS: Current Outpatient Prescriptions  Medication Sig Dispense Refill  .  ANUCORT-HC 25 MG suppository as needed.    Marland Kitchen atomoxetine (STRATTERA) 100 MG capsule Take 100 mg by mouth daily.    Marland Kitchen lisinopril (PRINIVIL,ZESTRIL) 40 MG tablet Take 40 mg by mouth daily.    Marland Kitchen LORazepam (ATIVAN) 1 MG tablet Take 1 tablet (1 mg total) by mouth every 8 (eight) hours as needed for anxiety or sleep. 15 tablet 0  . Multiple Vitamin (MULTIVITAMIN WITH MINERALS) TABS Take 1 tablet by mouth daily.    . pantoprazole (PROTONIX) 40 MG tablet Take 40 mg by mouth daily.    .  sertraline (ZOLOFT) 100 MG tablet Take 100 mg by mouth daily.    . varenicline (CHANTIX CONTINUING MONTH PAK) 1 MG tablet Take 1 mg by mouth daily.    . Vitamin D, Ergocalciferol, (DRISDOL) 50000 UNITS CAPS capsule Take 50,000 Units by mouth every 7 (seven) days. Takes on wednesday    . zolpidem (AMBIEN) 10 MG tablet Take 10 mg by mouth at bedtime as needed. For sleep     No current facility-administered medications for this visit.    LABS/IMAGING: No results found for this or any previous visit (from the past 48 hour(s)). No results found.  VITALS: BP 134/90 mmHg  Pulse 87  Ht 5\' 7"  (1.702 m)  Wt 269 lb (122.018 kg)  BMI 42.12 kg/m2  EXAM: deferred  EKG: deferred  ASSESSMENT: 1. GERD 2. Abnormal EKG - negative nuclear stress 3. Ongoing tobacco abuse 4. Hypertension - controlled 5. Near morbid obesity 6. Strong family history of coronary disease 7. Snoring/non-restorative sleep/fatigue-concerning for sleep apnea  PLAN: 1.   Mike Webb did indeed have sleep apnea. His symptoms have improved significantly with CPAP and he should continue to use that. I suspect his energy level will improve allowing him to work on exercise and weight loss. I've encouraged his dietary changes. His hypertension is well controlled on lisinopril. Overall he is at low risk to start a more intensive exercise program. I'm happy to see him back on an as-needed basis.  Thanks again for the kind referral.  Pixie Casino, MD, [facility] Attending Cardiologist [facility]  HILTY,Kenneth C 12/08/2013, 11:15 AM

## 2013-12-08 NOTE — Patient Instructions (Signed)
Your physician recommends that you schedule a follow-up appointment as needed  

## 2013-12-14 IMAGING — US US ABDOMEN COMPLETE
1 series · 14 of 25 positions shown · non-contrast
Comparison: 06/21/2007

CLINICAL DATA: Elevated liver function tests.

COMPLETE ABDOMINAL ULTRASOUND

[Series 1: us abdomen complete · 0.32mm/px · 14 of 72 slices shown]
[im 1/72]
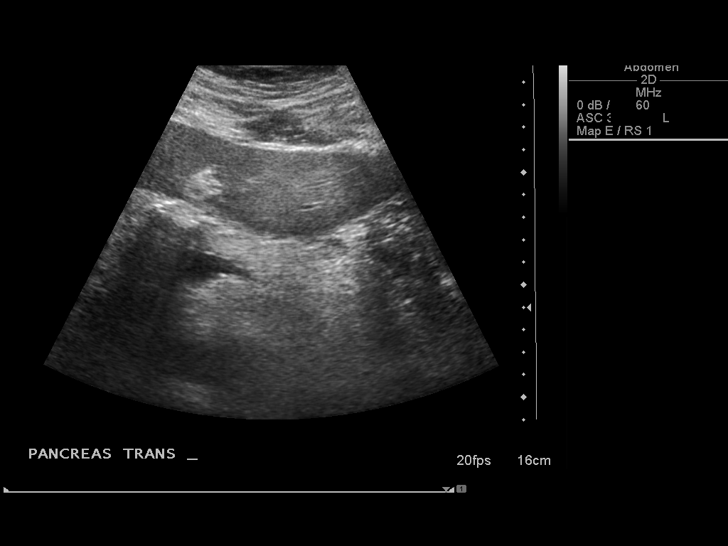
[im 6/72]
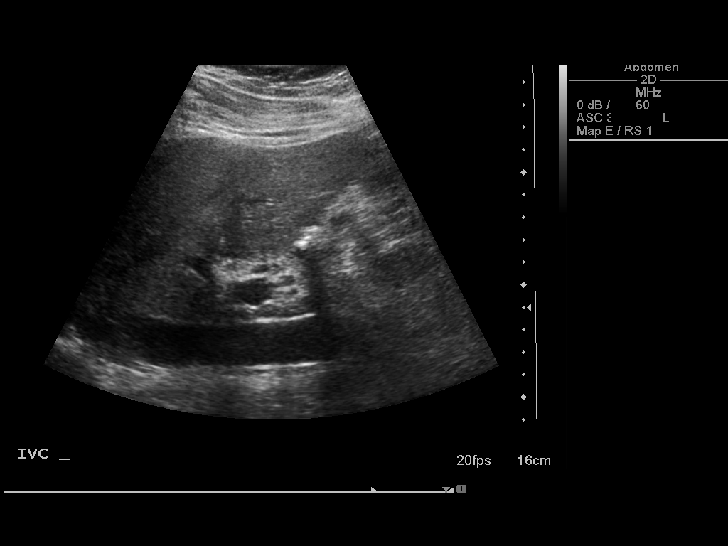
[im 12/72]
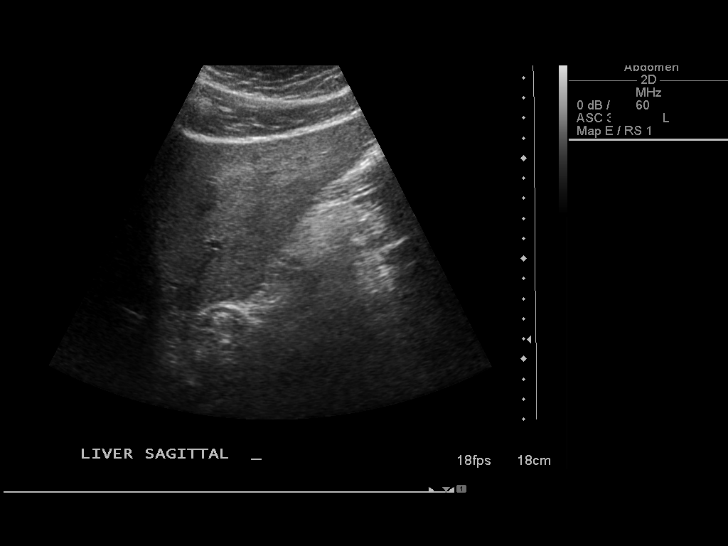
[im 18/72]
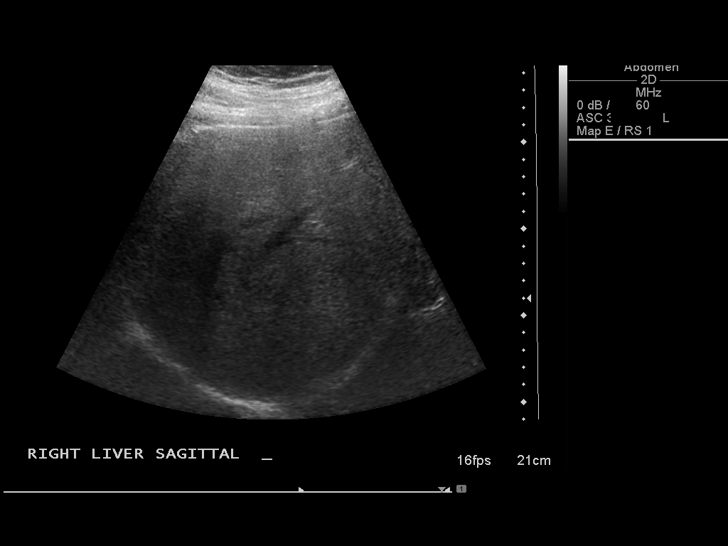
[im 24/72]
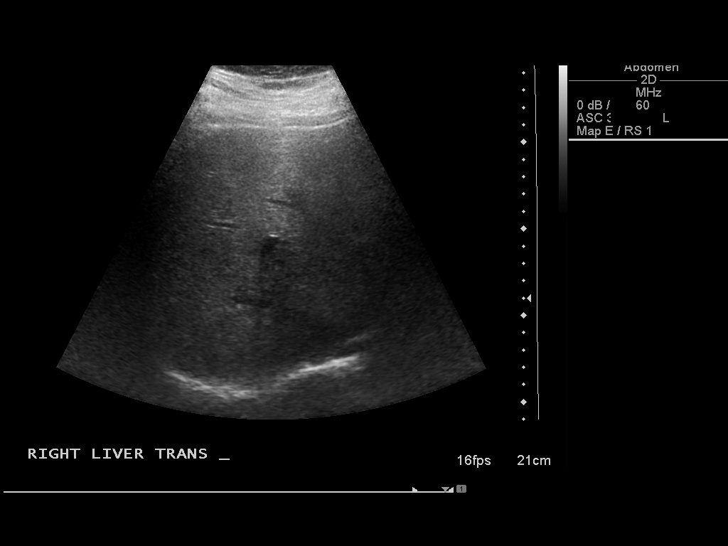
[im 27/72]
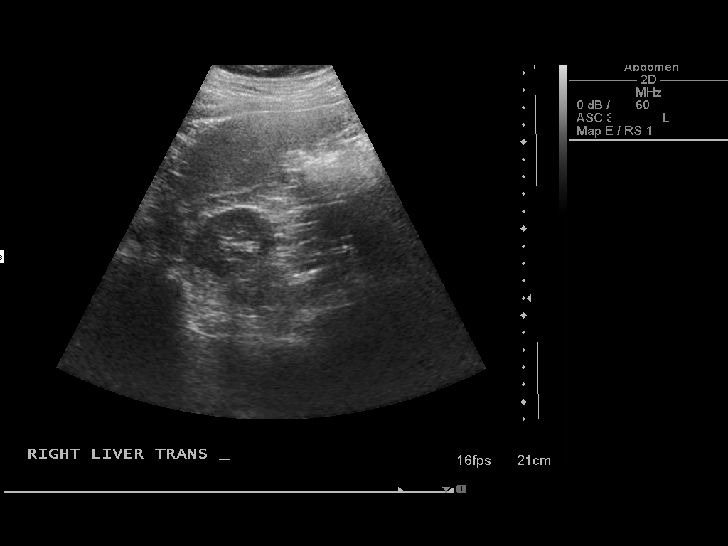
[im 33/72]
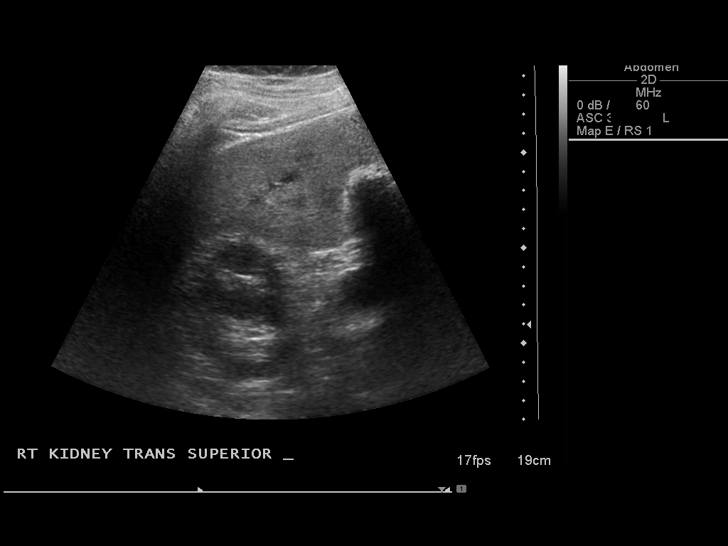
[im 39/72]
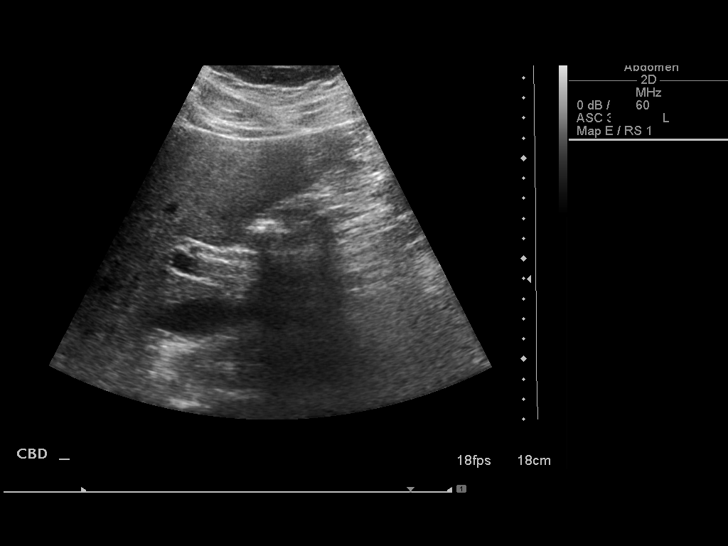
[im 45/72]
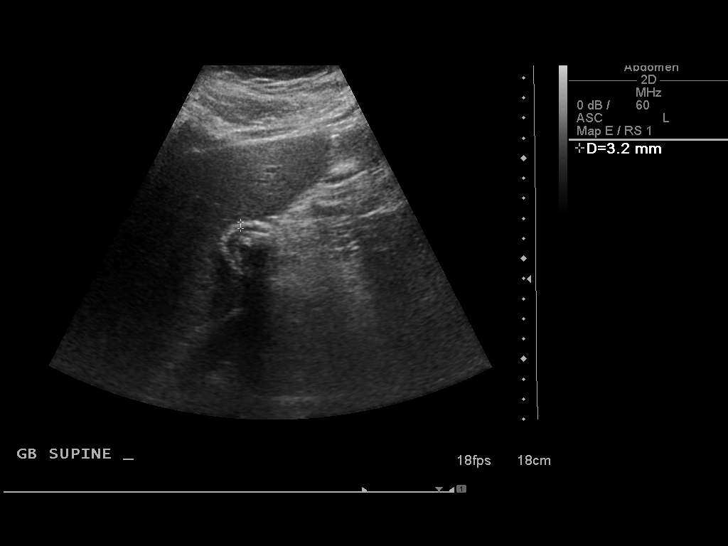
[im 48/72]
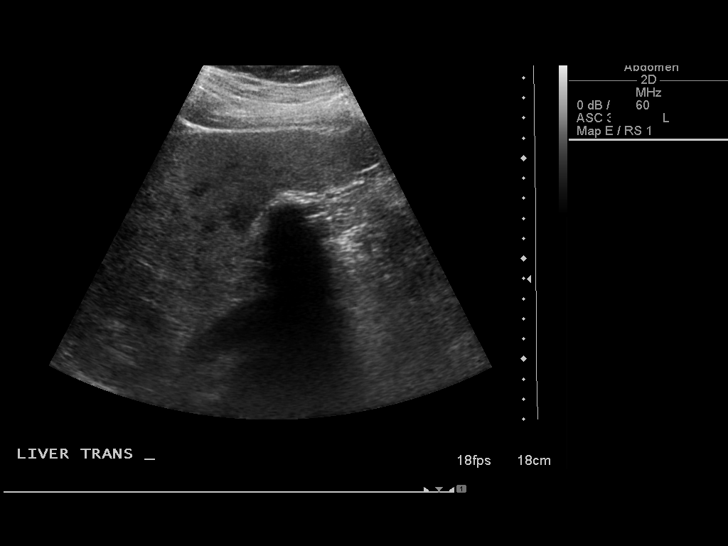
[im 54/72]
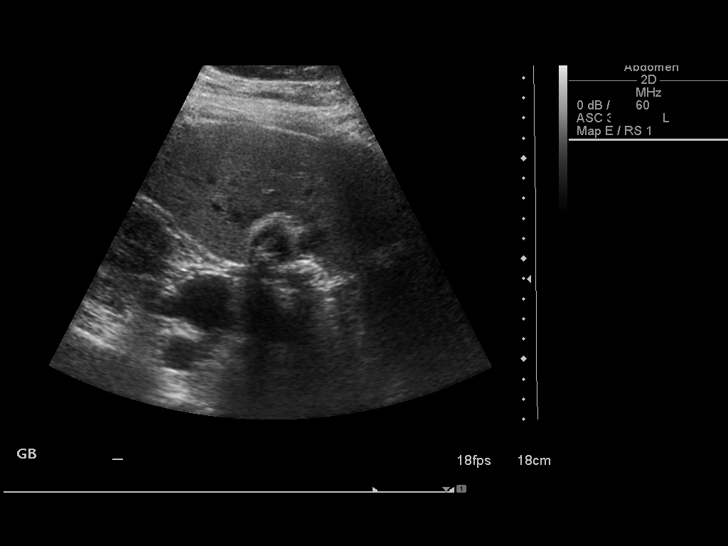
[im 60/72]
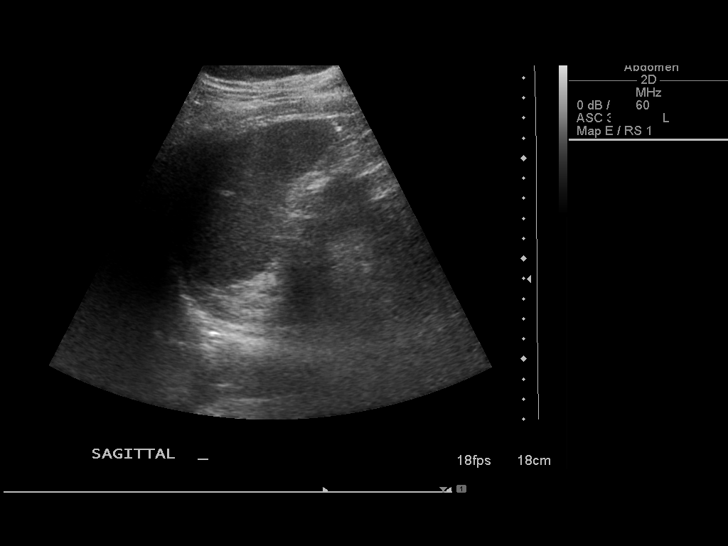
[im 66/72]
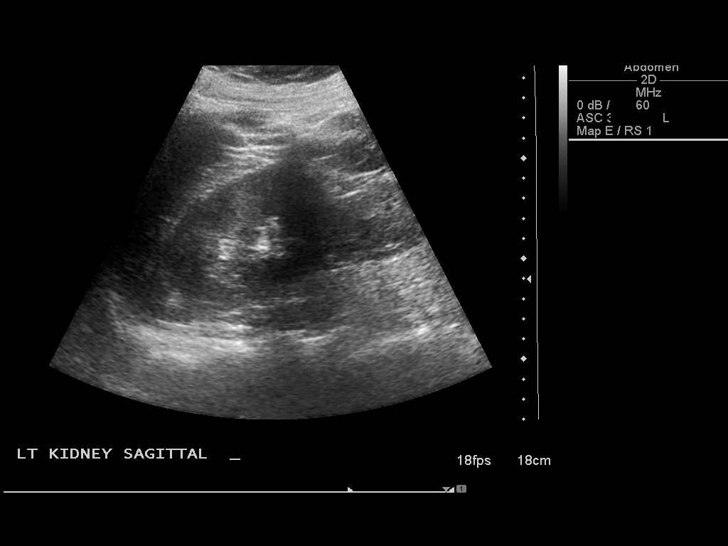
[im 72/72]
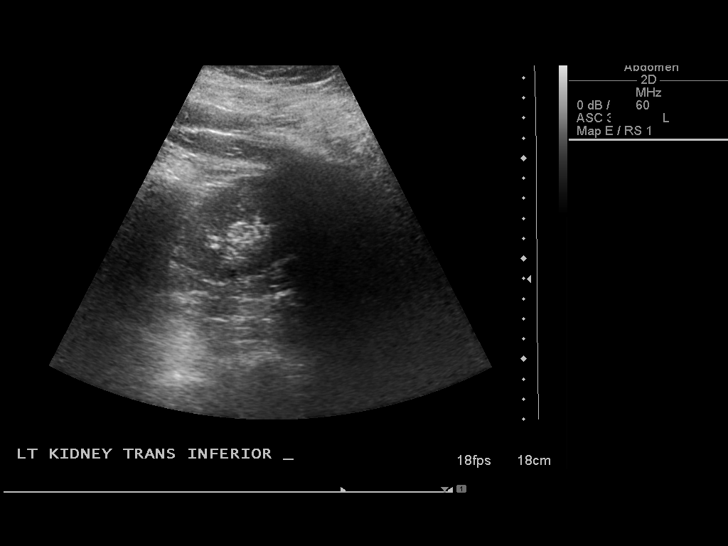

[14 of 25 positions shown; findings below may reference images not displayed]

FINDINGS: Gallbladder:  A "wall echo shadow complex".  No pericholecystic
fluid.  Wall thickness upper normal, 3 mm.  The patient is tender
over the gallbladder, but sonographic Murphy's sign was not
elicited.

Common bile duct: Normal, 5 mm.

Liver: Mildly increased in echogenicity with mildly heterogeneous
echotexture.

IVC: Negative

Pancreas:  Partially obscured pancreatic tail secondary bowel gas.

Spleen:  Normal in size and echogenicity.

Right Kidney:  13.4 cm. No hydronephrosis.

Left Kidney:  13.1 cm. No hydronephrosis.

Abdominal aorta:  Nonaneurysmal without ascites.
IMPRESSION: "Wall echo shadow" complex, most consistent with a stone filled
gallbladder.  The largest stone measures 1.5 cm.  Although the
patient is tender in the region of the gallbladder, no specific
evidence of acute cholecystitis is identified.

Heterogeneous hepatic steatosis.

## 2013-12-14 IMAGING — CT CT HEAD W/O CM
1 of 2 series · 13 of 30 positions shown, 17 images · non-contrast
Comparison: None.

CLINICAL DATA: Left-sided weakness. Code stroke.

CT HEAD WITHOUT CONTRAST
TECHNIQUE: Contiguous axial images were obtained from the base of
the skull through the vertex without contrast.

[Series 2: brain · axial · 0.47mm/px · z∈[+31,+160]mm · 13 of 28 slices shown, 17 images]
[im 2/28  brain]
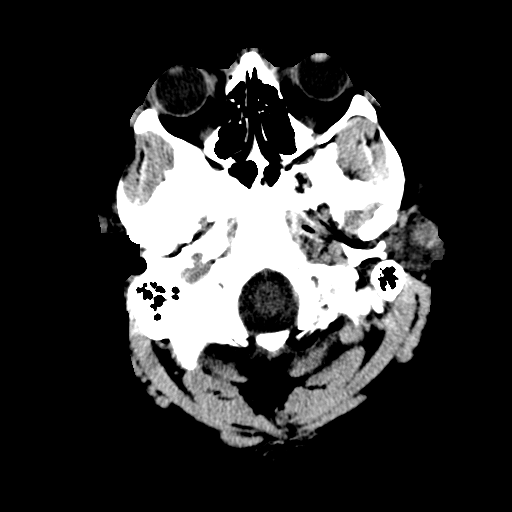
[im 2/28  bone]
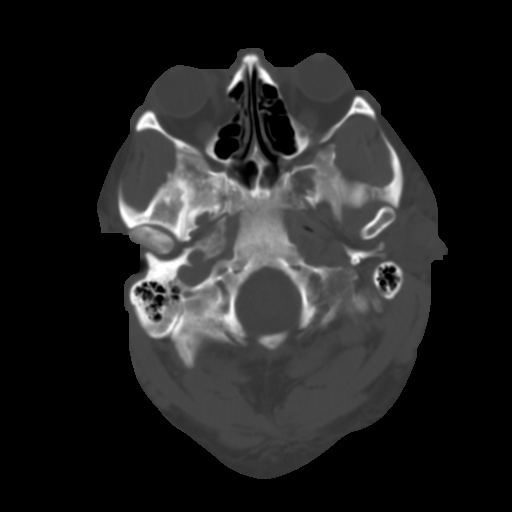
[im 4/28  brain]
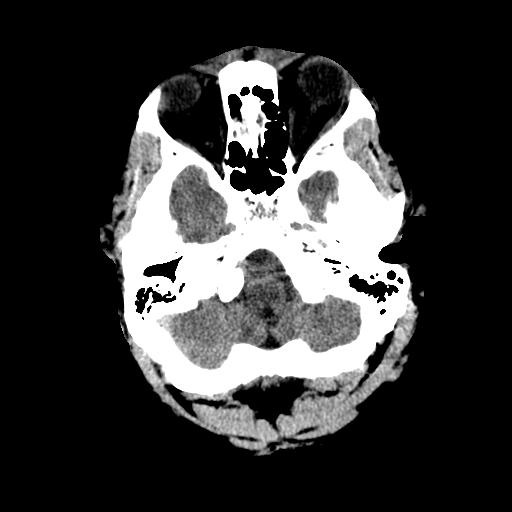
[im 6/28  brain]
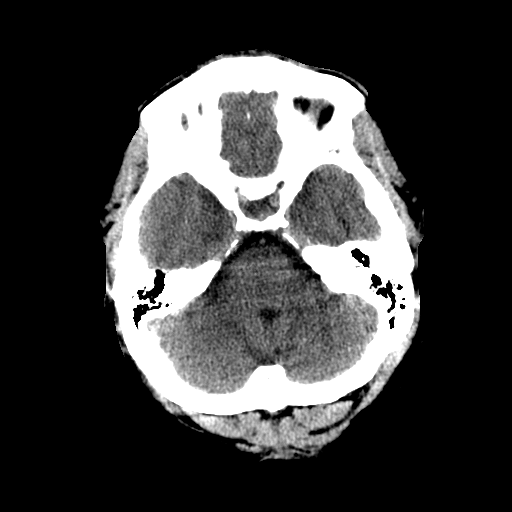
[im 8/28  brain]
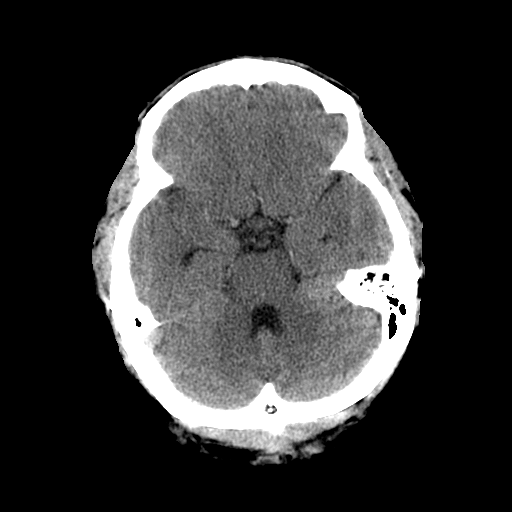
[im 10/28  brain]
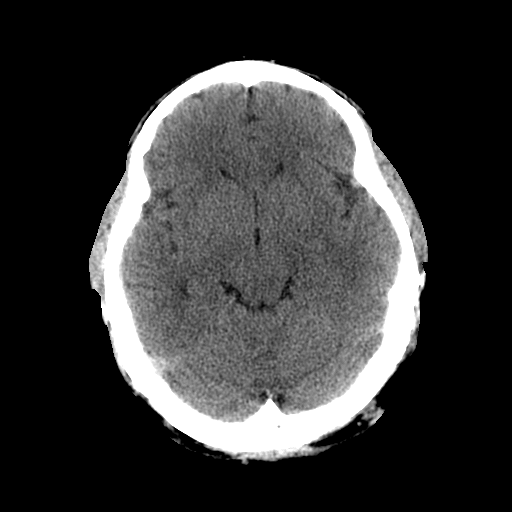
[im 10/28  bone]
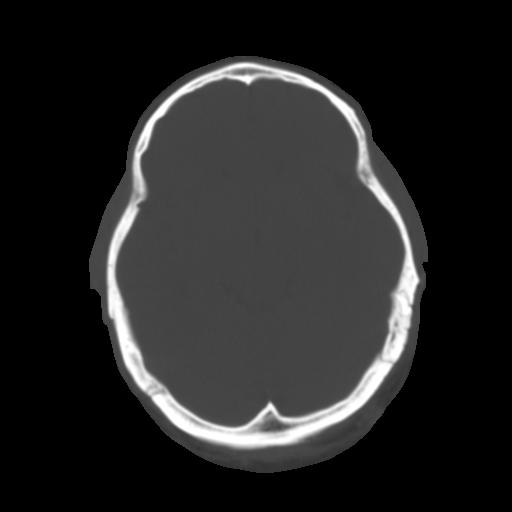
[im 12/28  brain]
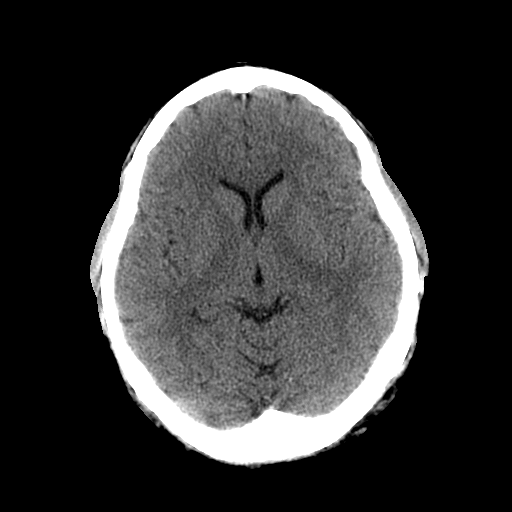
[im 14/28  brain]
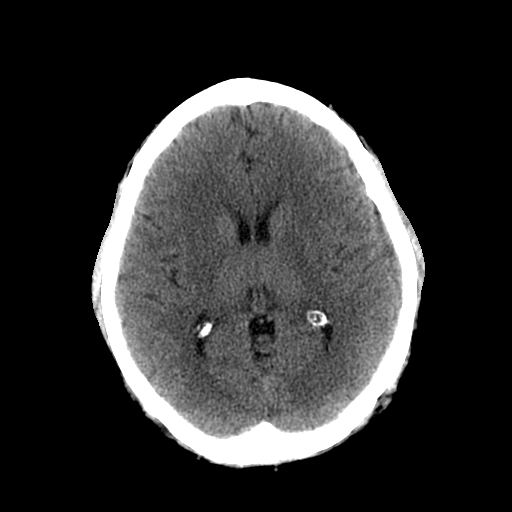
[im 16/28  brain]
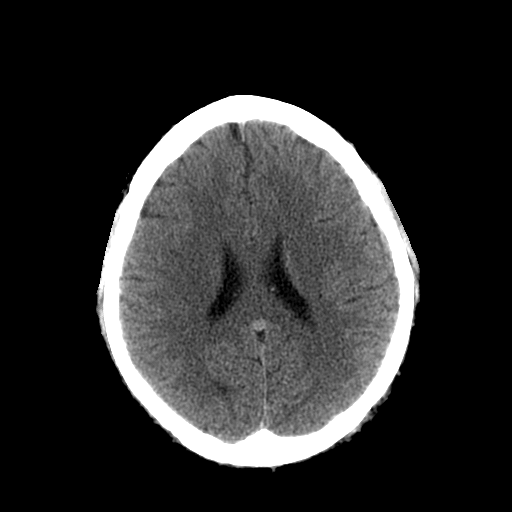
[im 18/28  brain]
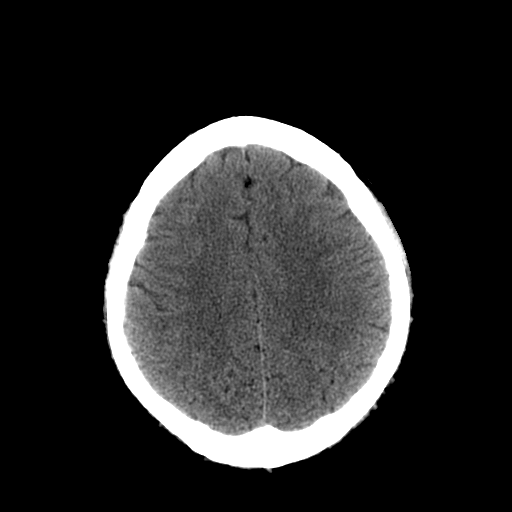
[im 18/28  bone]
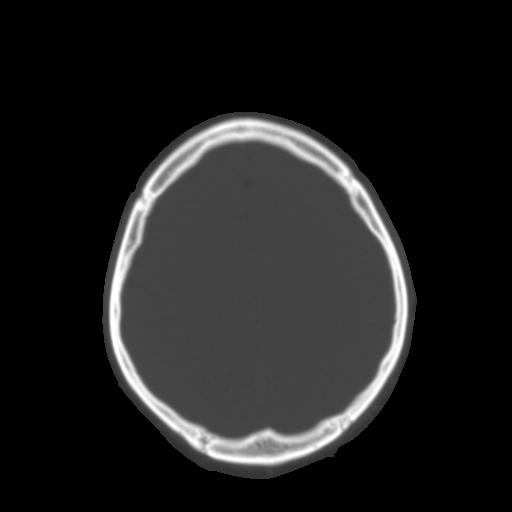
[im 20/28  brain]
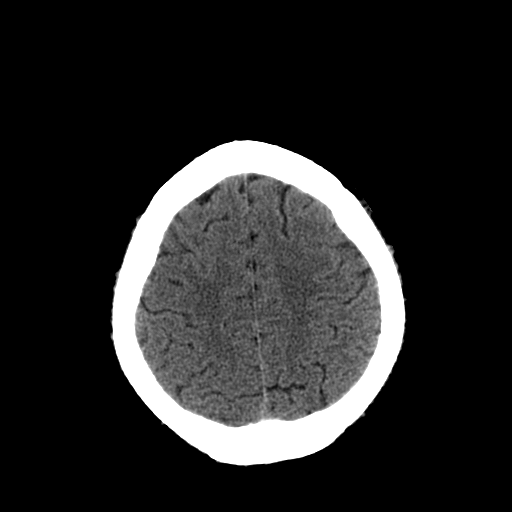
[im 22/28  brain]
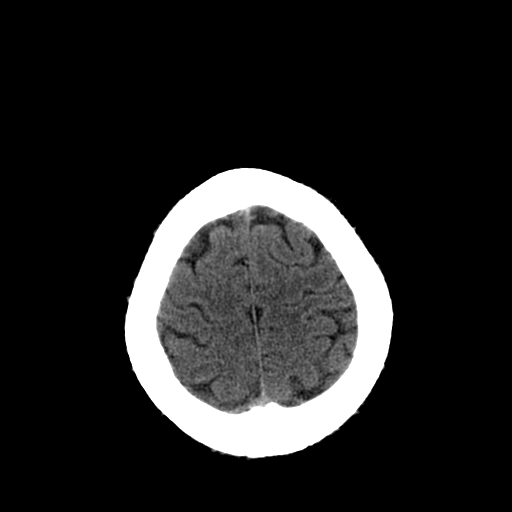
[im 24/28  brain]
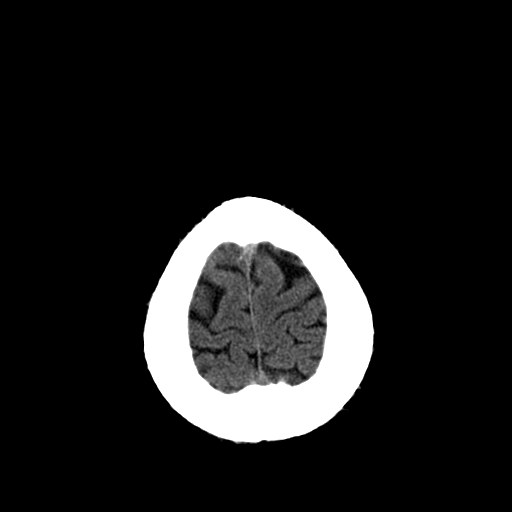
[im 26/28  brain]
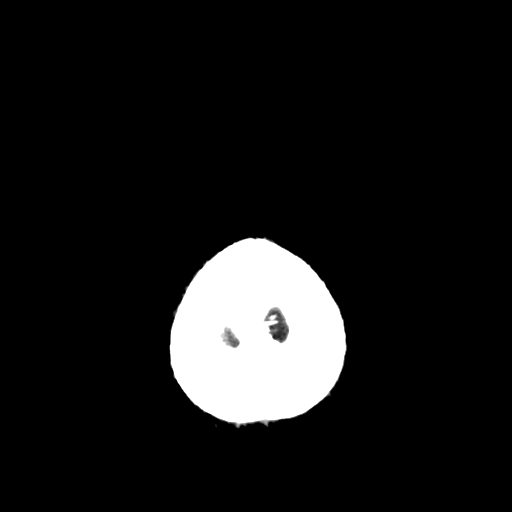
[im 26/28  bone]
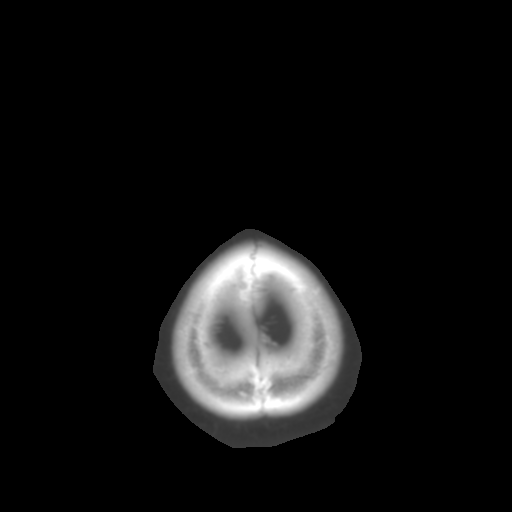

[13 of 30 positions shown; findings below may reference images not displayed]

FINDINGS: Normal appearance of the intracranial structures.  There
is no evidence for acute hemorrhage, mass lesion, midline shift,
hydrocephalus or large infarct.  There is deformity of the right
medial orbital that probably represents old trauma.  Frontal
sinuses are absent.
IMPRESSION: No acute intracranial abnormality.

These results were called by telephone on 02/09/2012 at [DATE] p.m.
to Dr. Mallens, who verbally acknowledged these results.

## 2014-02-02 ENCOUNTER — Encounter: Payer: Self-pay | Admitting: Cardiovascular Disease

## 2014-02-02 ENCOUNTER — Ambulatory Visit (INDEPENDENT_AMBULATORY_CARE_PROVIDER_SITE_OTHER): Payer: BLUE CROSS/BLUE SHIELD | Admitting: Cardiovascular Disease

## 2014-02-02 VITALS — BP 140/90 | HR 87 | Ht 67.0 in | Wt 275.9 lb

## 2014-02-02 DIAGNOSIS — G4733 Obstructive sleep apnea (adult) (pediatric): Secondary | ICD-10-CM

## 2014-02-02 DIAGNOSIS — Z9989 Dependence on other enabling machines and devices: Principal | ICD-10-CM

## 2014-02-02 DIAGNOSIS — I10 Essential (primary) hypertension: Secondary | ICD-10-CM

## 2014-02-02 NOTE — Patient Instructions (Signed)
Your physician recommends that you schedule a follow-up appointment as needed for sleep with Dr. Claiborne Billings.

## 2014-02-04 ENCOUNTER — Encounter: Payer: Self-pay | Admitting: Cardiovascular Disease

## 2014-02-04 NOTE — Progress Notes (Signed)
Patient ID: Mike Webb, male   DOB: 1970/04/16, 44 y.o.   MRN: [id number]     HPI: Mike Webb, is a 44 y.o. male who is followed by Dr. Debara Pickett for primary cardiology care.  He presents to sleep clinic today for  following his diagnosis of obstructive sleep apnea and institution of CPAP therapy.  Mr. [name] has a history of morbid obesity, hypertension, and family history for premature coronary artery disease.  He has significant issues with fatigability [place], snoring and nonrestorative sleep.  He is referred for diagnostic polysomnogram by Dr. Debara Pickett which initially was interpreted by [facility] in September 2015.  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  Subsequent, he is referred for CPAP titration trial, which was interpreted by me on 11/18/2013.  CPAP was initiated at 5 cm and required titration up to 17 cm water pressure.  His mean oxygen saturation was 95% during sleep with the lowest oxygen saturation with non-REM sleep at 88% and with REM sleep at 90%.  He did not have any periodic limb movements.  He was started on CPAP therapy and has an area sense 10 AutoSet unit but this has been set at a fixed pressure of 17 cm.  I reviewed a download  from 12/30/2013 through 01/28/2014.  He is meeting compliance standards as far as usage stays, but he had only used his machine 22 of 30 days, giving a 73% use.  He is averaging 5 hours and 25 minutes, but there were only 18 days her 60% were he used greater than 4 hours.  His AHI was significant improved at 5.0.  He was still noted to have some apneic as well as hypopneic events.  Prior to CPAP therapy, he had significant hypersomnolence and his Epworth Sleepiness Scale score endorsed at 19.  Resolute, the patient states that typically he falls asleep between 8 and 9 PM while watching television.  He often sleeps 3-4 hours while  lying on the couch or in a recliner and doesn't actually go to bed until 1 AM and then wakes up at 6 AM.  He only puts his CPAP unit on when he goes to bed.   Past Medical History  Diagnosis Date  . Neck mass   . Clotting disorder   . Sickle cell trait   . Pulmonary embolism   . Cancer   . GERD (gastroesophageal reflux disease)     takes protonix    Past Surgical History  Procedure Laterality Date  . Surgery scrotal / testicular    . Cholecystectomy  02/21/2012    Procedure: LAPAROSCOPIC CHOLECYSTECTOMY WITH INTRAOPERATIVE CHOLANGIOGRAM;  Surgeon: Madilyn Hook, DO;  Location: [place];  Service: General;  Laterality: N/A;    No Known Allergies  Current Outpatient Prescriptions  Medication Sig Dispense Refill  . ANUCORT-HC 25 MG suppository as needed.    Marland Kitchen atomoxetine (STRATTERA) 100 MG capsule Take 100 mg by mouth daily.    Hendricks Limes CONTINUING MONTH PAK 1 MG tablet Take 1 mg by mouth daily. 2 tablets daily.  0  . lisinopril (PRINIVIL,ZESTRIL) 40 MG tablet Take 40 mg by mouth daily.    Marland Kitchen LORazepam (ATIVAN) 1 MG tablet Take 1 tablet (1 mg total) by mouth every 8 (eight) hours as needed for anxiety or sleep. 15 tablet 0  . Multiple Vitamin (MULTIVITAMIN WITH MINERALS) TABS Take 1 tablet by mouth  daily.    . pantoprazole (PROTONIX) 40 MG tablet Take 40 mg by mouth daily.    . sertraline (ZOLOFT) 100 MG tablet Take 100 mg by mouth daily.    . Vitamin D, Ergocalciferol, (DRISDOL) 50000 UNITS CAPS capsule Take 50,000 Units by mouth every 7 (seven) days. Takes on wednesday    . zolpidem (AMBIEN) 10 MG tablet Take 10 mg by mouth at bedtime as needed. For sleep     No current facility-administered medications for this visit.    History   Social History  . Marital Status: Single    Spouse Name: N/A    Number of Children: N/A  . Years of Education: N/A   Occupational History  . Not on file.   Social History Main Topics  . Smoking status: Former Smoker -- 0.50 packs/day    Quit  date: 10/08/2013  . Smokeless tobacco: Never Used     Comment: 1-2 cigarettes weekly  . Alcohol Use: 0.6 oz/week    1 Not specified per week  . Drug Use: Yes    Special: Marijuana  . Sexual Activity: Not on file   Other Topics Concern  . Not on file   Social History Narrative    Family History  Problem Relation Age of Onset  . Hypertension Mother   . Heart attack Father   . Stroke Father   . Hypertension Father   . Hypertension Sister   . Hypertension Brother      ROS General: Negative; No fevers, chills, or night sweats HEENT: Negative; No changes in vision or hearing, sinus congestion, difficulty swallowing Pulmonary: Negative; No cough, wheezing, shortness of breath, hemoptysis Cardiovascular: Negative; No chest pain, presyncope, syncope, palpatations GI: Negative; No nausea, vomiting, diarrhea, or abdominal pain GU: Negative; No dysuria, hematuria, or difficulty voiding Musculoskeletal: Negative; no myalgias, joint pain, or weakness Hematologic: Negative; no easy bruising, bleeding Endocrine: Negative; no heat/cold intolerance Neuro: Negative; no changes in balance, headaches Skin: Negative; No rashes or skin lesions Psychiatric: Negative; No behavioral problems, depression Sleep: Negative; No daytime sleepiness, hypersomnolence, bruxism, restless legs, hypnogognic hallucinations, no cataplexy   Physical Exam BP 140/90 mmHg  Pulse 87  Ht 5\' 7"  (1.702 m)  Wt 275 lb 14.4 oz (125.147 kg)  BMI 43.20 kg/m2  General: Alert, oriented, no distress.  Skin: normal turgor, no rashes HEENT: Normocephalic, atraumatic. Pupils round and reactive; sclera anicteric; extraocular muscles intact; Fundi mild increase vessel tortuosity.  No hemorrhages or exudates. Nose without nasal septal hypertrophy Mouth/Parynx benign; Mallinpatti scale 3/4 Neck: Thick neck No JVD, no carotid briuts Lungs: clear to ausculatation and percussion; no wheezing or rales  Chest wall: No tenderness  to palpation Heart: RRR, s1 s2 normal, no S3 or S4 gallop.  No significant murmur, rubs, thrills or heaves. Abdomen: soft, nontender; no hepatosplenomehaly, BS+; abdominal aorta nontender and not dilated by palpation. Back: No CVA tenderness Pulses 2+ Extremities: no clubbinbg cyanosis or edema, Homan's sign negative  Neurologic: grossly nonfocal; cranial nerves intact. Psychological: Normal affect and mood.   LABS:  BMET    Component Value Date/Time   NA 138 11/04/2012 0643   K 3.9 11/04/2012 0643   CL 101 11/04/2012 0643   CO2 24 11/04/2012 0643   GLUCOSE 87 11/04/2012 0643   BUN 10 11/04/2012 0643   CREATININE 0.72 11/04/2012 0643   CALCIUM 9.4 11/04/2012 0643   GFRNONAA >90 11/04/2012 0643   GFRAA >90 11/04/2012 0643     Hepatic Function Panel  Component Value Date/Time   PROT 8.4* 11/04/2012 0643   ALBUMIN 4.4 11/04/2012 0643   AST 23 11/04/2012 0643   ALT 22 11/04/2012 0643   ALKPHOS 49 11/04/2012 0643   BILITOT 0.4 11/04/2012 0643     CBC    Component Value Date/Time   WBC 6.5 11/04/2012 0643   RBC 5.99* 11/04/2012 0643   HGB 16.1 11/04/2012 0643   HCT 46.6 11/04/2012 0643   PLT 201 11/04/2012 0643   MCV 77.8* 11/04/2012 0643   MCH 26.9 11/04/2012 0643   MCHC 34.5 11/04/2012 0643   RDW 13.5 11/04/2012 0643   LYMPHSABS 0.4* 11/04/2012 0643   MONOABS 0.2 11/04/2012 0643   EOSABS 0.2 11/04/2012 0643   BASOSABS 0.0 11/04/2012 0643     BNP No results found for: PROBNP  Lipid Panel     Component Value Date/Time   CHOL 205* 02/10/2012 0615   TRIG 222* 02/10/2012 0615   HDL 23* 02/10/2012 0615   CHOLHDL 8.9 02/10/2012 0615   VLDL 44* 02/10/2012 0615   LDLCALC 138* 02/10/2012 0615     RADIOLOGY: No results found.    ASSESSMENT AND PLAN: Mr. Quirino is a 44 year old gentleman with a body mass index today of 43.2, which is compatible with morbid obesity.  He has moderate obstructive sleep apnea and has felt somewhat improved since  initiating CPAP therapy.  I had long discussion with him today regarding normal sleep architecture and the adverse effects of sleep apnea on sleep architecture with reference to restorative sleep, frequent awakenings and delayed latency to REM sleep.  I suspect he still has some residual daytime sleepiness since he is not adequately using his CPAP therapy all night.  Oftentimes he is sleeping 3-4 hours on the couch without his CPAP therapy after he had fallen asleep while watching television.  I discussed sleep duration of approximately 7-8 hours every night with CPAP therapy.  We discussed Medicare compliance standards.  We also discussed the importance of utilizing the CPAP all night since the majority of REM sleep occurs in the second half of night.  We discussed the adverse consequences with reference to cardiovascular health if sleep apnea is left untreated.  I am changing his17 cm  set pressure mode to an auto mode, which will allow for pressure titration up to a maximum of 20 cm water pressure if needed.  He is using a fullface mask.  We discussed cleansing and hygiene of his supplies.  I also provided him data with reference to good sleep hygiene.  A new download will be obtained on his auto unit and this will be reviewed when available.  I will see him on an as-needed basis from a sleep perspective, and he will return to the primary cardiology care with Dr. Debara Pickett.  Time spent: 30 minutes   Troy Sine, MD, [facility]  02/04/2014 7:16 PM

## 2014-03-19 ENCOUNTER — Encounter: Payer: Self-pay | Admitting: Cardiovascular Disease
